# Patient Record
Sex: Female | Born: 1965 | Race: White | Hispanic: No | Marital: Married | Smoking: Former smoker
Health system: Southern US, Community
[De-identification: ages and names within clinical notes are randomized; demographics above are authoritative.]

## PROBLEM LIST (undated history)

## (undated) DIAGNOSIS — E876 Hypokalemia: Secondary | ICD-10-CM

## (undated) DIAGNOSIS — L03314 Cellulitis of groin: Secondary | ICD-10-CM

## (undated) DIAGNOSIS — K648 Other hemorrhoids: Secondary | ICD-10-CM

## (undated) DIAGNOSIS — J329 Chronic sinusitis, unspecified: Secondary | ICD-10-CM

## (undated) DIAGNOSIS — M1990 Unspecified osteoarthritis, unspecified site: Secondary | ICD-10-CM

## (undated) DIAGNOSIS — J9383 Other pneumothorax: Secondary | ICD-10-CM

## (undated) DIAGNOSIS — E538 Deficiency of other specified B group vitamins: Secondary | ICD-10-CM

## (undated) DIAGNOSIS — D649 Anemia, unspecified: Secondary | ICD-10-CM

## (undated) HISTORY — DX: Other hemorrhoids: K64.8

## (undated) HISTORY — PX: WISDOM TOOTH EXTRACTION: SHX21

## (undated) HISTORY — DX: Chronic sinusitis, unspecified: J32.9

## (undated) HISTORY — DX: Other pneumothorax: J93.83

## (undated) HISTORY — DX: Hypokalemia: E87.6

## (undated) HISTORY — DX: Deficiency of other specified B group vitamins: E53.8

## (undated) HISTORY — DX: Unspecified osteoarthritis, unspecified site: M19.90

## (undated) HISTORY — DX: Cellulitis of groin: L03.314

## (undated) HISTORY — DX: Anemia, unspecified: D64.9

---

## 1997-12-26 ENCOUNTER — Other Ambulatory Visit: Admission: RE | Admit: 1997-12-26 | Discharge: 1997-12-26 | Payer: Self-pay | Admitting: *Deleted

## 1998-02-21 ENCOUNTER — Ambulatory Visit (HOSPITAL_COMMUNITY): Admission: RE | Admit: 1998-02-21 | Discharge: 1998-02-21 | Payer: Self-pay | Admitting: *Deleted

## 1999-02-11 ENCOUNTER — Other Ambulatory Visit: Admission: RE | Admit: 1999-02-11 | Discharge: 1999-02-11 | Payer: Self-pay | Admitting: *Deleted

## 1999-09-10 ENCOUNTER — Inpatient Hospital Stay (HOSPITAL_COMMUNITY): Admission: AD | Admit: 1999-09-10 | Discharge: 1999-09-12 | Payer: Self-pay | Admitting: *Deleted

## 2000-09-23 ENCOUNTER — Other Ambulatory Visit: Admission: RE | Admit: 2000-09-23 | Discharge: 2000-09-23 | Payer: Self-pay | Admitting: Obstetrics and Gynecology

## 2002-08-23 ENCOUNTER — Encounter: Admission: RE | Admit: 2002-08-23 | Discharge: 2002-08-23 | Payer: Self-pay | Admitting: Family Medicine

## 2002-08-23 ENCOUNTER — Encounter: Payer: Self-pay | Admitting: Family Medicine

## 2002-08-24 ENCOUNTER — Other Ambulatory Visit: Admission: RE | Admit: 2002-08-24 | Discharge: 2002-08-24 | Payer: Self-pay | Admitting: Obstetrics and Gynecology

## 2002-08-25 ENCOUNTER — Encounter: Payer: Self-pay | Admitting: Obstetrics and Gynecology

## 2002-08-25 ENCOUNTER — Encounter: Admission: RE | Admit: 2002-08-25 | Discharge: 2002-08-25 | Payer: Self-pay | Admitting: Internal Medicine

## 2002-08-25 ENCOUNTER — Encounter: Admission: RE | Admit: 2002-08-25 | Discharge: 2002-08-25 | Payer: Self-pay | Admitting: Obstetrics and Gynecology

## 2002-08-25 ENCOUNTER — Encounter: Payer: Self-pay | Admitting: Internal Medicine

## 2002-08-26 ENCOUNTER — Encounter: Admission: RE | Admit: 2002-08-26 | Discharge: 2002-08-26 | Payer: Self-pay | Admitting: Internal Medicine

## 2002-08-26 ENCOUNTER — Inpatient Hospital Stay (HOSPITAL_COMMUNITY): Admission: EM | Admit: 2002-08-26 | Discharge: 2002-09-04 | Payer: Self-pay | Admitting: Internal Medicine

## 2002-08-26 ENCOUNTER — Encounter: Payer: Self-pay | Admitting: Internal Medicine

## 2002-08-28 DIAGNOSIS — J9383 Other pneumothorax: Secondary | ICD-10-CM

## 2002-08-28 HISTORY — DX: Other pneumothorax: J93.83

## 2002-08-28 HISTORY — PX: LUNG SURGERY: SHX703

## 2002-08-30 ENCOUNTER — Encounter (INDEPENDENT_AMBULATORY_CARE_PROVIDER_SITE_OTHER): Payer: Self-pay | Admitting: Specialist

## 2002-08-30 ENCOUNTER — Encounter: Payer: Self-pay | Admitting: Cardiothoracic Surgery

## 2002-08-31 ENCOUNTER — Encounter: Payer: Self-pay | Admitting: Cardiothoracic Surgery

## 2002-09-01 ENCOUNTER — Encounter: Payer: Self-pay | Admitting: Cardiothoracic Surgery

## 2002-09-02 ENCOUNTER — Encounter: Payer: Self-pay | Admitting: Cardiothoracic Surgery

## 2002-09-03 ENCOUNTER — Encounter: Payer: Self-pay | Admitting: Cardiothoracic Surgery

## 2002-09-04 ENCOUNTER — Encounter: Payer: Self-pay | Admitting: Cardiothoracic Surgery

## 2002-09-16 ENCOUNTER — Encounter: Admission: RE | Admit: 2002-09-16 | Discharge: 2002-09-16 | Payer: Self-pay | Admitting: Cardiothoracic Surgery

## 2002-09-16 ENCOUNTER — Encounter: Payer: Self-pay | Admitting: Cardiothoracic Surgery

## 2002-09-19 ENCOUNTER — Encounter: Admission: RE | Admit: 2002-09-19 | Discharge: 2002-09-19 | Payer: Self-pay | Admitting: Cardiothoracic Surgery

## 2002-09-19 ENCOUNTER — Encounter: Payer: Self-pay | Admitting: Cardiothoracic Surgery

## 2003-08-30 ENCOUNTER — Encounter: Payer: Self-pay | Admitting: Internal Medicine

## 2003-08-30 HISTORY — PX: COLONOSCOPY: SHX174

## 2003-11-30 ENCOUNTER — Other Ambulatory Visit: Admission: RE | Admit: 2003-11-30 | Discharge: 2003-11-30 | Payer: Self-pay | Admitting: Obstetrics and Gynecology

## 2004-02-08 ENCOUNTER — Ambulatory Visit: Payer: Self-pay | Admitting: Family Medicine

## 2005-05-29 ENCOUNTER — Ambulatory Visit: Payer: Self-pay | Admitting: Family Medicine

## 2005-06-03 ENCOUNTER — Encounter: Admission: RE | Admit: 2005-06-03 | Discharge: 2005-06-03 | Payer: Self-pay | Admitting: Family Medicine

## 2005-07-21 ENCOUNTER — Ambulatory Visit: Payer: Self-pay | Admitting: Family Medicine

## 2005-10-06 ENCOUNTER — Ambulatory Visit: Payer: Self-pay | Admitting: Family Medicine

## 2005-10-14 ENCOUNTER — Ambulatory Visit: Payer: Self-pay | Admitting: Family Medicine

## 2005-10-28 ENCOUNTER — Ambulatory Visit: Payer: Self-pay | Admitting: Internal Medicine

## 2005-11-28 ENCOUNTER — Encounter (INDEPENDENT_AMBULATORY_CARE_PROVIDER_SITE_OTHER): Payer: Self-pay | Admitting: *Deleted

## 2005-11-28 ENCOUNTER — Ambulatory Visit: Payer: Self-pay | Admitting: Internal Medicine

## 2006-01-05 ENCOUNTER — Ambulatory Visit: Payer: Self-pay | Admitting: Internal Medicine

## 2006-02-02 ENCOUNTER — Ambulatory Visit: Payer: Self-pay | Admitting: Family Medicine

## 2006-02-06 ENCOUNTER — Emergency Department (HOSPITAL_COMMUNITY): Admission: EM | Admit: 2006-02-06 | Discharge: 2006-02-07 | Payer: Self-pay | Admitting: Emergency Medicine

## 2006-02-06 ENCOUNTER — Ambulatory Visit: Payer: Self-pay | Admitting: Family Medicine

## 2006-02-06 LAB — CONVERTED CEMR LAB
Alkaline Phosphatase: 63 units/L (ref 39–117)
Basophils Absolute: 0 10*3/uL (ref 0.0–0.1)
Basophils Relative: 0.4 % (ref 0.0–1.0)
Eosinophil percent: 2.2 % (ref 0.0–5.0)
GFR calc non Af Amer: 85 mL/min
Glucose, Bld: 109 mg/dL — ABNORMAL HIGH (ref 70–99)
H Pylori IgG: NEGATIVE
HCT: 34.5 % — ABNORMAL LOW (ref 36.0–46.0)
Hemoglobin: 11.6 g/dL — ABNORMAL LOW (ref 12.0–15.0)
MCV: 81.6 fL (ref 78.0–100.0)
Monocytes Absolute: 0.7 10*3/uL (ref 0.2–0.7)
Neutrophils Relative %: 74.3 % (ref 43.0–77.0)
Platelets: 511 10*3/uL — ABNORMAL HIGH (ref 150–400)
Potassium: 2.9 meq/L — ABNORMAL LOW (ref 3.5–5.1)
Sodium: 134 meq/L — ABNORMAL LOW (ref 135–145)
Total Bilirubin: 0.5 mg/dL (ref 0.3–1.2)
WBC: 9.6 10*3/uL (ref 4.5–10.5)

## 2006-02-11 ENCOUNTER — Inpatient Hospital Stay (HOSPITAL_COMMUNITY): Admission: EM | Admit: 2006-02-11 | Discharge: 2006-02-19 | Payer: Self-pay | Admitting: Emergency Medicine

## 2006-02-11 ENCOUNTER — Encounter: Payer: Self-pay | Admitting: Internal Medicine

## 2006-02-11 ENCOUNTER — Encounter (INDEPENDENT_AMBULATORY_CARE_PROVIDER_SITE_OTHER): Payer: Self-pay | Admitting: Specialist

## 2006-02-12 ENCOUNTER — Ambulatory Visit: Payer: Self-pay | Admitting: Internal Medicine

## 2006-02-17 ENCOUNTER — Encounter: Payer: Self-pay | Admitting: Vascular Surgery

## 2006-02-26 ENCOUNTER — Ambulatory Visit: Payer: Self-pay | Admitting: Internal Medicine

## 2006-02-26 LAB — CONVERTED CEMR LAB
Albumin: 1.7 g/dL — ABNORMAL LOW (ref 3.5–5.2)
Alkaline Phosphatase: 64 units/L (ref 39–117)
Calcium: 7.8 mg/dL — ABNORMAL LOW (ref 8.4–10.5)
Chloride: 98 meq/L (ref 96–112)
Eosinophils Relative: 0.9 % (ref 0.0–5.0)
GFR calc Af Amer: 229 mL/min
Glucose, Bld: 115 mg/dL — ABNORMAL HIGH (ref 70–99)
HCT: 22.4 % — CL (ref 36.0–46.0)
Lymphocytes Relative: 13.5 % (ref 12.0–46.0)
Monocytes Relative: 3.5 % (ref 3.0–11.0)
Neutrophils Relative %: 81.4 % — ABNORMAL HIGH (ref 43.0–77.0)
Sodium: 135 meq/L (ref 135–145)
Total Protein: 4.8 g/dL — ABNORMAL LOW (ref 6.0–8.3)

## 2006-03-13 ENCOUNTER — Ambulatory Visit: Payer: Self-pay | Admitting: Internal Medicine

## 2006-03-13 ENCOUNTER — Ambulatory Visit (HOSPITAL_COMMUNITY): Admission: RE | Admit: 2006-03-13 | Discharge: 2006-03-13 | Payer: Self-pay | Admitting: Internal Medicine

## 2006-04-01 ENCOUNTER — Ambulatory Visit: Payer: Self-pay | Admitting: Internal Medicine

## 2006-04-01 LAB — CONVERTED CEMR LAB
AST: 18 units/L (ref 0–37)
Basophils Absolute: 0 10*3/uL (ref 0.0–0.1)
Basophils Relative: 0 % (ref 0.0–1.0)
Eosinophils Absolute: 0 10*3/uL (ref 0.0–0.6)
Eosinophils Relative: 0.6 % (ref 0.0–5.0)
Hemoglobin: 8.5 g/dL — ABNORMAL LOW (ref 12.0–15.0)
MCHC: 31.5 g/dL (ref 30.0–36.0)
MCV: 80.3 fL (ref 78.0–100.0)
Monocytes Absolute: 0.5 10*3/uL (ref 0.2–0.7)
Neutro Abs: 4.7 10*3/uL (ref 1.4–7.7)
RDW: 18.2 % — ABNORMAL HIGH (ref 11.5–14.6)
Total Protein: 5.1 g/dL — ABNORMAL LOW (ref 6.0–8.3)

## 2006-04-07 ENCOUNTER — Ambulatory Visit: Payer: Self-pay | Admitting: Internal Medicine

## 2006-04-14 ENCOUNTER — Ambulatory Visit: Payer: Self-pay | Admitting: Internal Medicine

## 2006-04-14 LAB — CONVERTED CEMR LAB
ALT: 15 units/L (ref 0–40)
AST: 16 units/L (ref 0–37)
Alkaline Phosphatase: 41 units/L (ref 39–117)
BUN: 11 mg/dL (ref 6–23)
CO2: 28 meq/L (ref 19–32)
Calcium: 8.2 mg/dL — ABNORMAL LOW (ref 8.4–10.5)
Eosinophils Relative: 1.4 % (ref 0.0–5.0)
HCT: 26 % — ABNORMAL LOW (ref 36.0–46.0)
MCV: 82.2 fL (ref 78.0–100.0)
Monocytes Relative: 6.1 % (ref 3.0–11.0)
Neutro Abs: 3.8 10*3/uL (ref 1.4–7.7)
Neutrophils Relative %: 63.3 % (ref 43.0–77.0)
Platelets: 296 10*3/uL (ref 150–400)
RBC: 3.17 M/uL — ABNORMAL LOW (ref 3.87–5.11)
WBC: 6 10*3/uL (ref 4.5–10.5)

## 2006-05-05 ENCOUNTER — Ambulatory Visit: Payer: Self-pay | Admitting: Internal Medicine

## 2006-05-05 LAB — CONVERTED CEMR LAB
ALT: 16 units/L (ref 0–40)
AST: 18 units/L (ref 0–37)
BUN: 13 mg/dL (ref 6–23)
Basophils Absolute: 0 10*3/uL (ref 0.0–0.1)
Basophils Relative: 0.5 % (ref 0.0–1.0)
Bilirubin, Direct: 0.1 mg/dL (ref 0.0–0.3)
Calcium: 8.6 mg/dL (ref 8.4–10.5)
Chloride: 111 meq/L (ref 96–112)
Creatinine, Ser: 0.6 mg/dL (ref 0.4–1.2)
Eosinophils Absolute: 0 10*3/uL (ref 0.0–0.6)
Eosinophils Relative: 0.8 % (ref 0.0–5.0)
GFR calc Af Amer: 143 mL/min
Glucose, Bld: 91 mg/dL (ref 70–99)
Monocytes Absolute: 0.4 10*3/uL (ref 0.2–0.7)
Neutrophils Relative %: 54.4 % (ref 43.0–77.0)
Platelets: 256 10*3/uL (ref 150–400)
Potassium: 4.6 meq/L (ref 3.5–5.1)
RDW: 18.2 % — ABNORMAL HIGH (ref 11.5–14.6)
Total Bilirubin: 0.6 mg/dL (ref 0.3–1.2)
Total Protein: 5.3 g/dL — ABNORMAL LOW (ref 6.0–8.3)

## 2006-05-08 ENCOUNTER — Ambulatory Visit: Payer: Self-pay | Admitting: Internal Medicine

## 2006-05-15 ENCOUNTER — Ambulatory Visit: Payer: Self-pay | Admitting: Internal Medicine

## 2006-05-22 ENCOUNTER — Ambulatory Visit: Payer: Self-pay | Admitting: Internal Medicine

## 2006-05-29 ENCOUNTER — Ambulatory Visit: Payer: Self-pay | Admitting: Internal Medicine

## 2006-06-30 ENCOUNTER — Ambulatory Visit: Payer: Self-pay | Admitting: Internal Medicine

## 2006-06-30 LAB — CONVERTED CEMR LAB
AST: 25 units/L (ref 0–37)
Basophils Relative: 0.1 % (ref 0.0–1.0)
Eosinophils Relative: 2.8 % (ref 0.0–5.0)
HCT: 36.5 % (ref 36.0–46.0)
Lymphocytes Relative: 32.7 % (ref 12.0–46.0)
Neutro Abs: 2.6 10*3/uL (ref 1.4–7.7)
Total Bilirubin: 0.7 mg/dL (ref 0.3–1.2)
Vitamin B-12: >1500 pg/mL — ABNORMAL HIGH (ref 211–911)
WBC: 4.5 10*3/uL (ref 4.5–10.5)

## 2006-07-14 ENCOUNTER — Ambulatory Visit: Payer: Self-pay | Admitting: Internal Medicine

## 2006-07-14 LAB — CONVERTED CEMR LAB
AST: 21 units/L (ref 0–37)
Albumin: 3.4 g/dL — ABNORMAL LOW (ref 3.5–5.2)
Alkaline Phosphatase: 38 units/L — ABNORMAL LOW (ref 39–117)
Basophils Relative: 0.5 % (ref 0.0–1.0)
Bilirubin, Direct: 0.1 mg/dL (ref 0.0–0.3)
CO2: 30 meq/L (ref 19–32)
CRP, High Sensitivity: 1 (ref 0.00–5.00)
Calcium: 9.1 mg/dL (ref 8.4–10.5)
Chloride: 104 meq/L (ref 96–112)
Creatinine, Ser: 0.7 mg/dL (ref 0.4–1.2)
Eosinophils Absolute: 0.1 10*3/uL (ref 0.0–0.6)
Glucose, Bld: 138 mg/dL — ABNORMAL HIGH (ref 70–99)
Hemoglobin: 12.6 g/dL (ref 12.0–15.0)
Monocytes Absolute: 0.2 10*3/uL (ref 0.2–0.7)
Monocytes Relative: 4.5 % (ref 3.0–11.0)
Neutrophils Relative %: 72.2 % (ref 43.0–77.0)
RBC: 4.39 M/uL (ref 3.87–5.11)
RDW: 16.1 % — ABNORMAL HIGH (ref 11.5–14.6)
Sed Rate: 10 mm/hr (ref 0–25)
Sodium: 139 meq/L (ref 135–145)

## 2006-08-03 ENCOUNTER — Ambulatory Visit: Payer: Self-pay | Admitting: Internal Medicine

## 2006-08-28 ENCOUNTER — Ambulatory Visit: Payer: Self-pay | Admitting: Internal Medicine

## 2006-09-21 ENCOUNTER — Ambulatory Visit: Payer: Self-pay | Admitting: Internal Medicine

## 2006-09-21 DIAGNOSIS — D649 Anemia, unspecified: Secondary | ICD-10-CM

## 2006-09-21 LAB — CONVERTED CEMR LAB
AST: 18 units/L (ref 0–37)
Bilirubin, Direct: 0.1 mg/dL (ref 0.0–0.3)
HCT: 34.3 % — ABNORMAL LOW (ref 36.0–46.0)
Lymphocytes Relative: 35.5 % (ref 12.0–46.0)
RBC: 3.92 M/uL (ref 3.87–5.11)
WBC: 3.6 10*3/uL — ABNORMAL LOW (ref 4.5–10.5)

## 2006-10-05 ENCOUNTER — Ambulatory Visit: Payer: Self-pay | Admitting: Internal Medicine

## 2006-12-04 ENCOUNTER — Ambulatory Visit: Payer: Self-pay | Admitting: Internal Medicine

## 2006-12-04 LAB — CONVERTED CEMR LAB
Basophils Absolute: 0 10*3/uL (ref 0.0–0.1)
Basophils Relative: 0.1 % (ref 0.0–1.0)
Hemoglobin: 10.3 g/dL — ABNORMAL LOW (ref 12.0–15.0)
Lymphocytes Relative: 20.9 % (ref 12.0–46.0)
MCV: 89.7 fL (ref 78.0–100.0)
Monocytes Absolute: 0.2 10*3/uL (ref 0.2–0.7)
Platelets: 223 10*3/uL (ref 150–400)
WBC: 5.5 10*3/uL (ref 4.5–10.5)

## 2007-01-18 ENCOUNTER — Encounter: Payer: Self-pay | Admitting: Family Medicine

## 2007-01-19 ENCOUNTER — Encounter: Payer: Self-pay | Admitting: Internal Medicine

## 2007-01-19 ENCOUNTER — Ambulatory Visit: Payer: Self-pay | Admitting: Internal Medicine

## 2007-01-19 ENCOUNTER — Ambulatory Visit: Payer: Self-pay | Admitting: Family Medicine

## 2007-01-19 DIAGNOSIS — E538 Deficiency of other specified B group vitamins: Secondary | ICD-10-CM | POA: Insufficient documentation

## 2007-01-19 DIAGNOSIS — J939 Pneumothorax, unspecified: Secondary | ICD-10-CM | POA: Insufficient documentation

## 2007-01-19 DIAGNOSIS — K5100 Ulcerative (chronic) pancolitis without complications: Secondary | ICD-10-CM

## 2007-01-19 DIAGNOSIS — J309 Allergic rhinitis, unspecified: Secondary | ICD-10-CM | POA: Insufficient documentation

## 2007-01-19 DIAGNOSIS — J930 Spontaneous tension pneumothorax: Secondary | ICD-10-CM | POA: Insufficient documentation

## 2007-01-19 DIAGNOSIS — L723 Sebaceous cyst: Secondary | ICD-10-CM

## 2007-01-19 DIAGNOSIS — D500 Iron deficiency anemia secondary to blood loss (chronic): Secondary | ICD-10-CM

## 2007-01-19 DIAGNOSIS — M810 Age-related osteoporosis without current pathological fracture: Secondary | ICD-10-CM | POA: Insufficient documentation

## 2007-01-19 LAB — CONVERTED CEMR LAB
Albumin: 3.8 g/dL (ref 3.5–5.2)
BUN: 12 mg/dL (ref 6–23)
Bilirubin, Direct: 0.2 mg/dL (ref 0.0–0.3)
CO2: 25 meq/L (ref 19–32)
Calcium: 8.9 mg/dL (ref 8.4–10.5)
Chloride: 104 meq/L (ref 96–112)
Creatinine, Ser: 0.6 mg/dL (ref 0.4–1.2)
Eosinophils Relative: 2.6 % (ref 0.0–5.0)
Folate: 7.1 ng/mL
Glucose, Bld: 124 mg/dL — ABNORMAL HIGH (ref 70–99)
Lymphocytes Relative: 27.5 % (ref 12.0–46.0)
MCHC: 33.4 g/dL (ref 30.0–36.0)
MCV: 83.5 fL (ref 78.0–100.0)
Monocytes Absolute: 0.2 10*3/uL (ref 0.2–0.7)
Monocytes Relative: 5.7 % (ref 3.0–11.0)
Neutrophils Relative %: 64.1 % (ref 43.0–77.0)
Potassium: 3.6 meq/L (ref 3.5–5.1)
Sodium: 136 meq/L (ref 135–145)
Transferrin: 348.4 mg/dL (ref >=212.0)
WBC: 4 10*3/uL — ABNORMAL LOW (ref 4.5–10.5)

## 2007-01-20 ENCOUNTER — Encounter: Payer: Self-pay | Admitting: Family Medicine

## 2007-01-20 ENCOUNTER — Encounter: Payer: Self-pay | Admitting: Internal Medicine

## 2007-01-20 LAB — CONVERTED CEMR LAB: Vit D, 1,25-Dihydroxy: 28 — ABNORMAL LOW (ref 30–89)

## 2007-02-11 ENCOUNTER — Telehealth (INDEPENDENT_AMBULATORY_CARE_PROVIDER_SITE_OTHER): Payer: Self-pay | Admitting: *Deleted

## 2007-03-19 ENCOUNTER — Ambulatory Visit: Payer: Self-pay | Admitting: Family Medicine

## 2007-03-19 ENCOUNTER — Telehealth (INDEPENDENT_AMBULATORY_CARE_PROVIDER_SITE_OTHER): Payer: Self-pay | Admitting: *Deleted

## 2007-04-01 ENCOUNTER — Ambulatory Visit: Payer: Self-pay | Admitting: Family Medicine

## 2007-04-01 LAB — CONVERTED CEMR LAB
Ketones, urine, test strip: NEGATIVE
Nitrite: NEGATIVE
Protein, U semiquant: NEGATIVE
Specific Gravity, Urine: <1.005
WBC Urine, dipstick: NEGATIVE

## 2007-04-12 LAB — CONVERTED CEMR LAB
Albumin: 4.1 g/dL (ref 3.5–5.2)
Alkaline Phosphatase: 44 units/L (ref 39–117)
BUN: 9 mg/dL (ref 6–23)
CO2: 26 meq/L (ref 19–32)
Chloride: 104 meq/L (ref 96–112)
Cholesterol: 227 mg/dL (ref 0–200)
Eosinophils Relative: 6.4 % — ABNORMAL HIGH (ref 0.0–5.0)
GFR calc non Af Amer: 118 mL/min
Glucose, Bld: 98 mg/dL (ref 70–99)
HDL: 63.2 mg/dL (ref >39.0)
Hemoglobin: 10 g/dL — ABNORMAL LOW (ref 12.0–15.0)
Lymphocytes Relative: 37.4 % (ref 12.0–46.0)
MCV: 76.6 fL — ABNORMAL LOW (ref 78.0–100.0)
Monocytes Absolute: 0.3 10*3/uL (ref 0.2–0.7)
Monocytes Relative: 6.8 % (ref 3.0–11.0)
Neutrophils Relative %: 49 % (ref 43.0–77.0)
Platelets: 220 10*3/uL (ref 150–400)
Potassium: 4 meq/L (ref 3.5–5.1)
Sodium: 136 meq/L (ref 135–145)
Total Bilirubin: 0.7 mg/dL (ref 0.3–1.2)
Total CHOL/HDL Ratio: 3.6
Triglycerides: 73 mg/dL (ref 0–149)

## 2007-04-15 DIAGNOSIS — F17200 Nicotine dependence, unspecified, uncomplicated: Secondary | ICD-10-CM | POA: Insufficient documentation

## 2007-04-15 DIAGNOSIS — M129 Arthropathy, unspecified: Secondary | ICD-10-CM | POA: Insufficient documentation

## 2007-04-15 DIAGNOSIS — K649 Unspecified hemorrhoids: Secondary | ICD-10-CM | POA: Insufficient documentation

## 2007-04-15 DIAGNOSIS — K5289 Other specified noninfective gastroenteritis and colitis: Secondary | ICD-10-CM | POA: Insufficient documentation

## 2008-01-12 ENCOUNTER — Encounter: Payer: Self-pay | Admitting: Family Medicine

## 2008-02-01 ENCOUNTER — Ambulatory Visit: Payer: Self-pay | Admitting: Internal Medicine

## 2008-02-02 ENCOUNTER — Encounter: Payer: Self-pay | Admitting: Internal Medicine

## 2008-02-03 LAB — CONVERTED CEMR LAB
ALT: 13 units/L (ref 0–35)
Alkaline Phosphatase: 51 units/L (ref 39–117)
Basophils Absolute: 0 10*3/uL (ref 0.0–0.1)
Basophils Relative: 0.7 % (ref 0.0–3.0)
CO2: 28 meq/L (ref 19–32)
Calcium: 9.3 mg/dL (ref 8.4–10.5)
Chloride: 101 meq/L (ref 96–112)
Creatinine, Ser: 0.5 mg/dL (ref 0.4–1.2)
Glucose, Bld: 123 mg/dL — ABNORMAL HIGH (ref 70–99)
HCT: 26 % — ABNORMAL LOW (ref 36.0–46.0)
Lymphocytes Relative: 31.7 % (ref 12.0–46.0)
MCV: 66.8 fL — ABNORMAL LOW (ref 78.0–100.0)
Monocytes Relative: 8.4 % (ref 3.0–12.0)
Neutrophils Relative %: 55.5 % (ref 43.0–77.0)
RBC: 3.88 M/uL (ref 3.87–5.11)
Sodium: 137 meq/L (ref 135–145)
Total Protein: 6.5 g/dL (ref 6.0–8.3)
Vitamin B-12: 442 pg/mL (ref 211–911)
WBC: 4.6 10*3/uL (ref 4.5–10.5)

## 2008-08-25 IMAGING — CR DG CHEST 2V
2 series · 2 of 2 positions shown · non-contrast
Comparison: 06/03/05
COMPARISON: None

CLINICAL DATA: Abdominal pain with nausea, vomiting, and fever.
 CHEST ? 2 VIEW:

[w chest pa *]
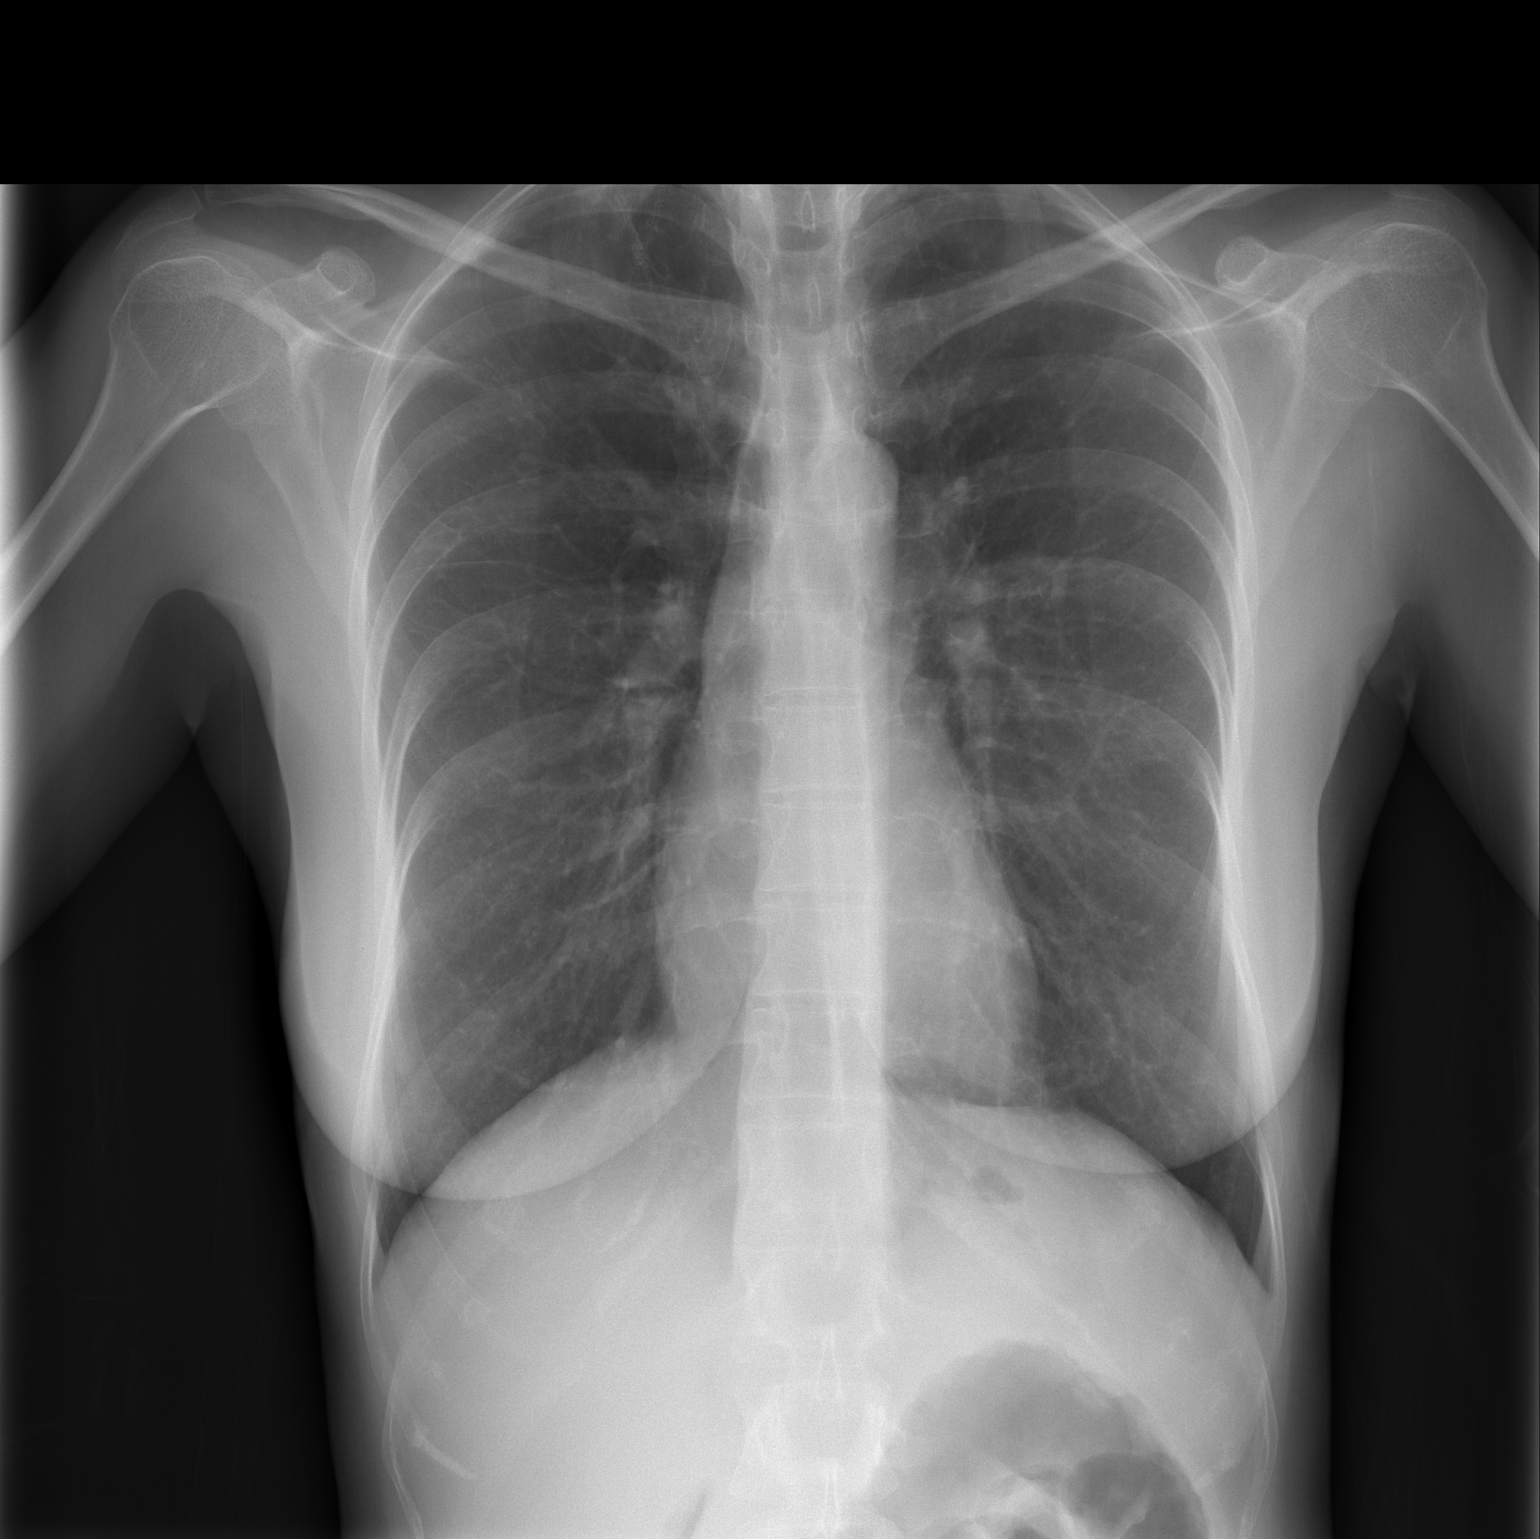

[w chest lat]
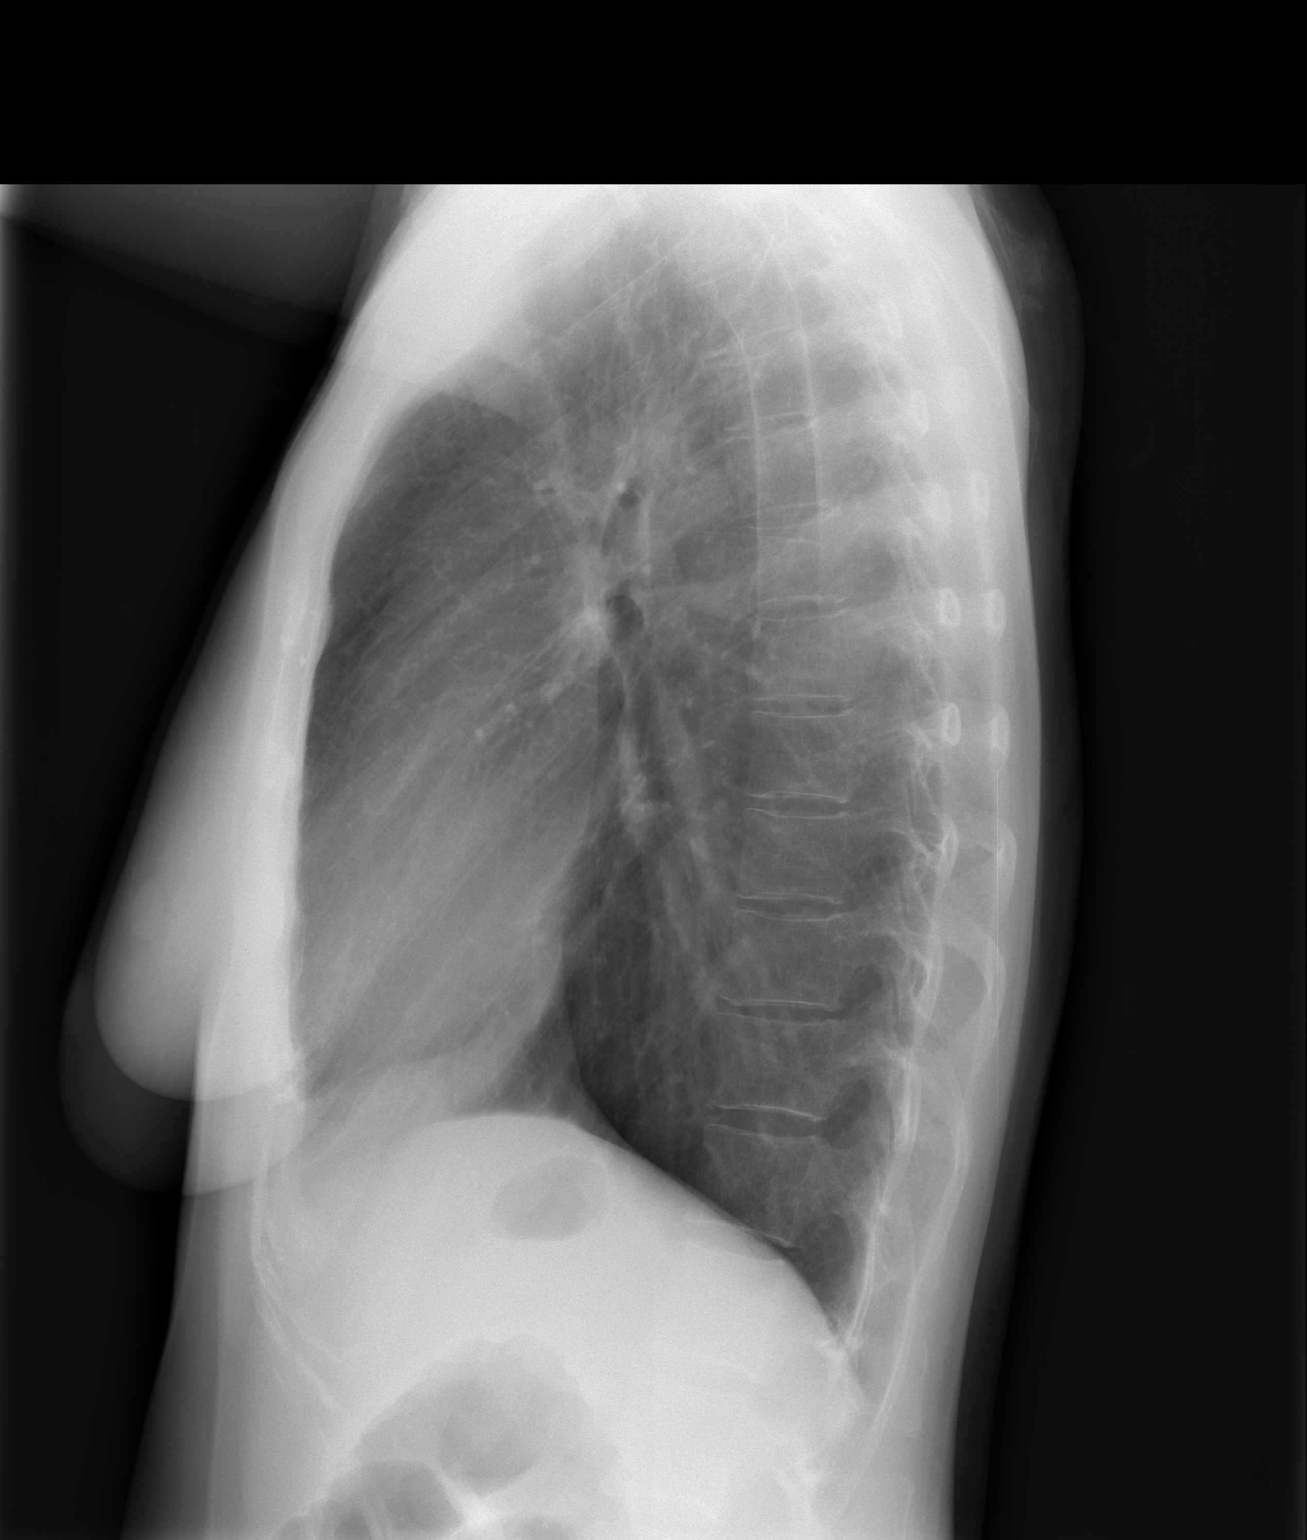

[2 of 2 positions shown; findings below may reference images not displayed]

FINDINGS: Status post surgical resection in right lung apex.  There are small gauge sutures and apical pleural thickening and scarring as before.  There is no acute process.  Lungs are mildly hyperaerated suggesting an element of COPD.  The bones appear somewhat demineralized for age but there are no lesions.
IMPRESSION: Chronic and postoperative changes ? no active disease.
 ABDOMEN ? 1 VIEW:
FINDINGS: The transverse colon, splenic flexure, and descending colon show probable bowel wall thickening and lack haustrations.  Findings are suspicious for colitis.  No large or small bowel obstruction.  No pneumatosis or portal venous air.
IMPRESSION: Findings suggestive of colitis.

## 2008-08-29 IMAGING — CR DG ABDOMEN 2V
2 series · 2 of 2 positions shown · non-contrast
Comparison: 02/10/06

CLINICAL DATA: Abdominal pain and bloating. 
 ABDOMEN- 2 VIEWS:

[view not recorded (1 of 2)]
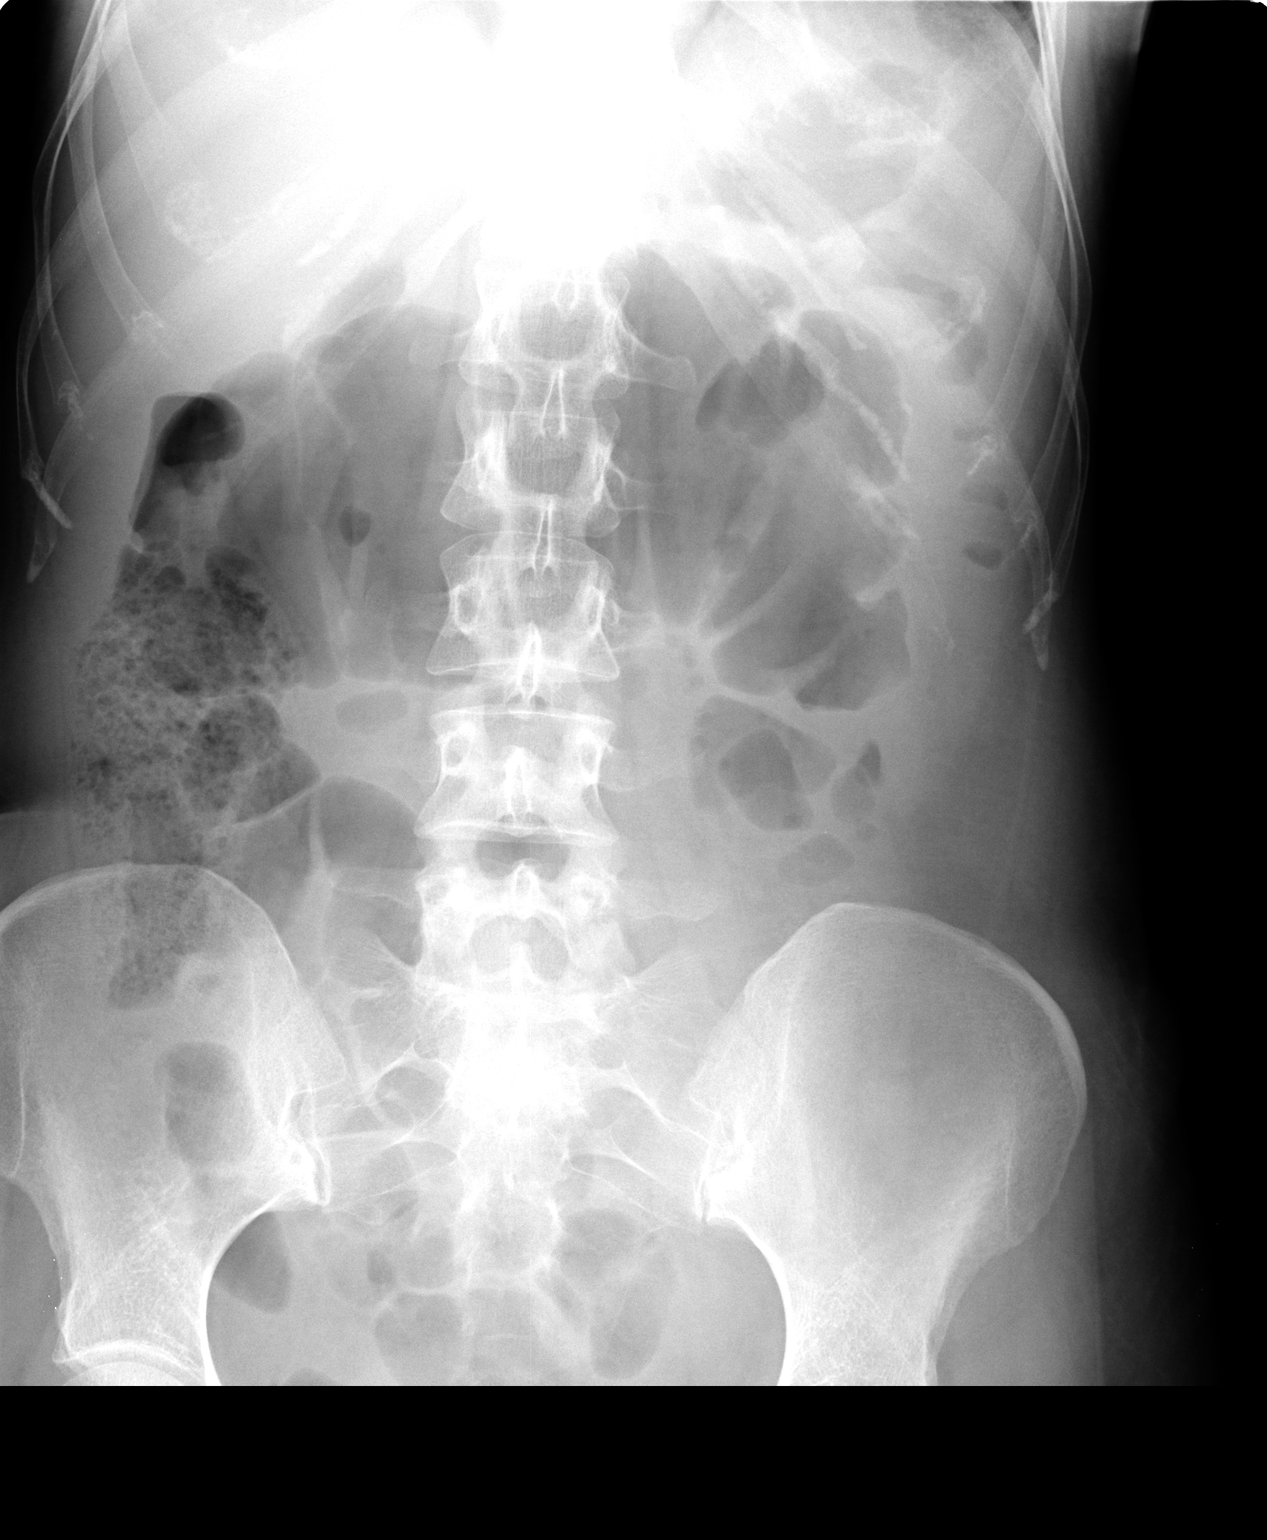

[view not recorded (2 of 2)]
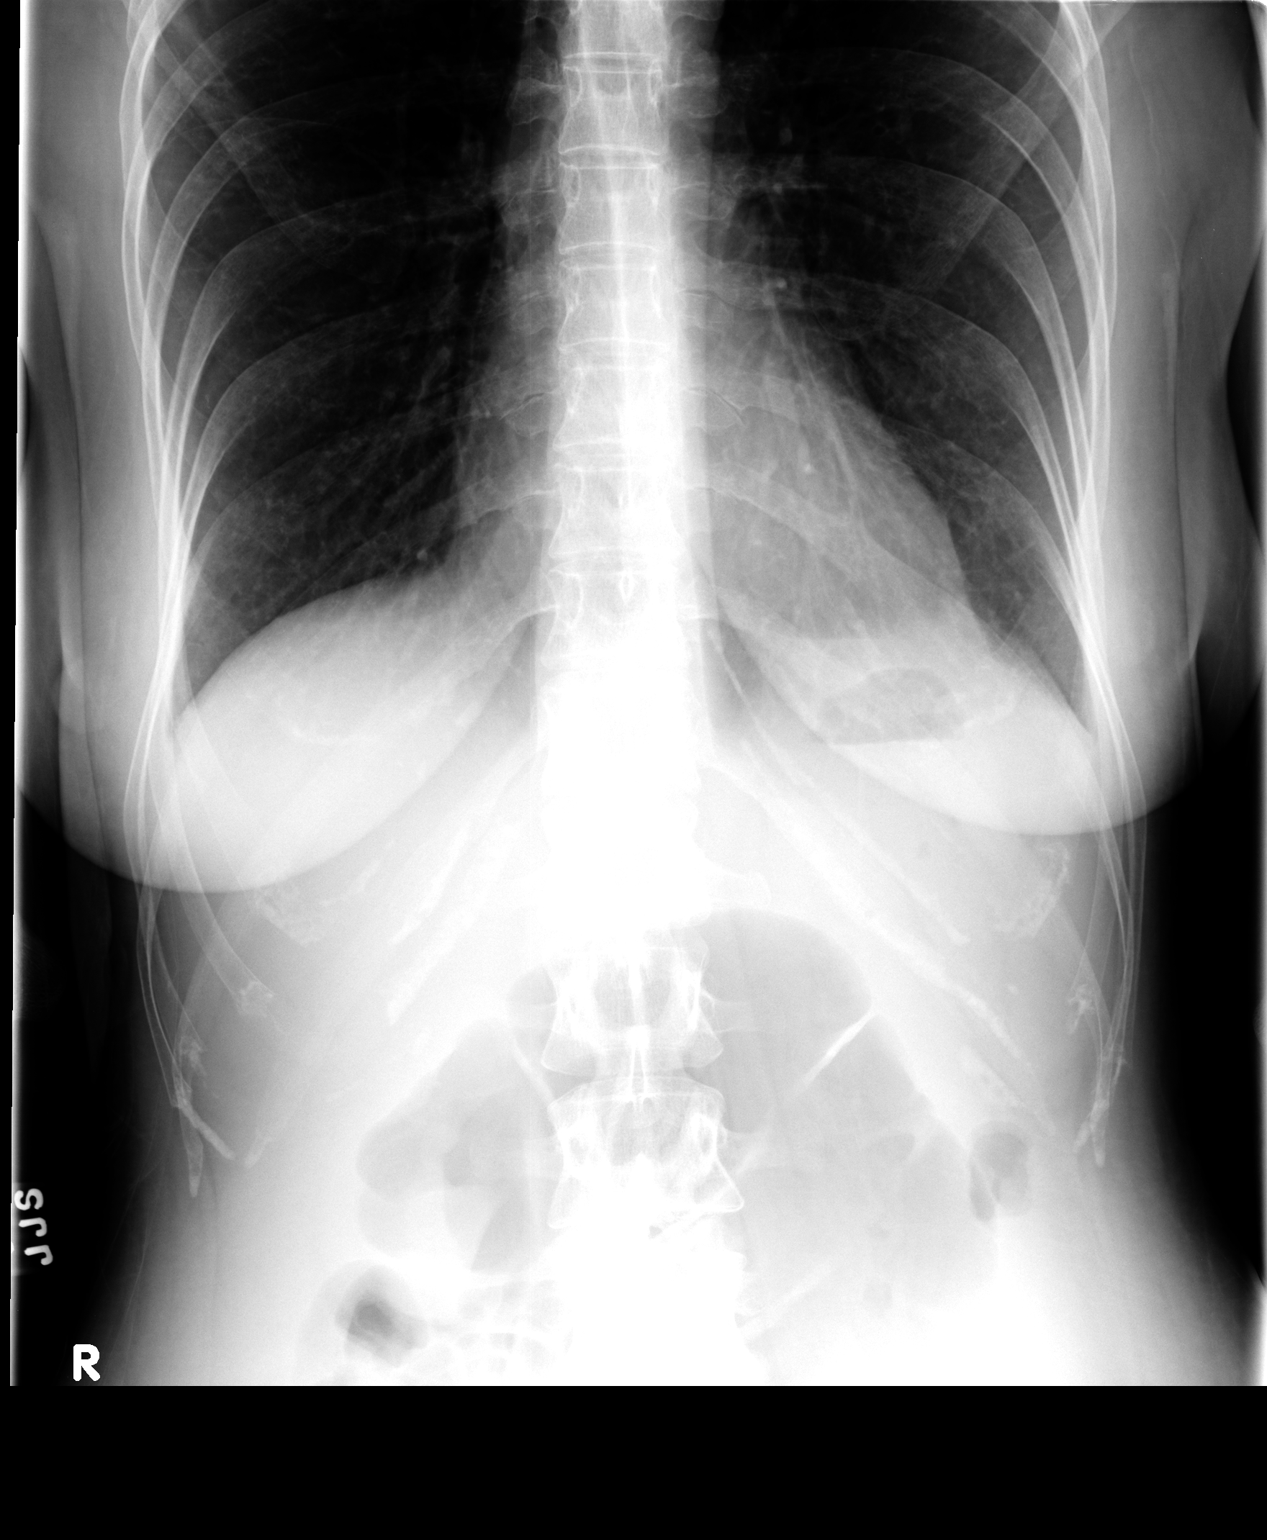

[2 of 2 positions shown; findings below may reference images not displayed]

FINDINGS: There is no free intraperitoneal air.  There is gaseous distention of the transverse colon.  Some gas is present in the sigmoid colon and rectum.  No evidence of bowel obstruction.
IMPRESSION: Gaseous distention of the transverse colon.  Negative for free air or small bowel obstruction.

## 2009-03-19 ENCOUNTER — Encounter: Payer: Self-pay | Admitting: Family Medicine

## 2009-12-25 ENCOUNTER — Telehealth: Payer: Self-pay | Admitting: Family Medicine

## 2010-01-29 ENCOUNTER — Encounter: Payer: Self-pay | Admitting: Family Medicine

## 2010-01-29 ENCOUNTER — Ambulatory Visit
Admission: RE | Admit: 2010-01-29 | Discharge: 2010-01-29 | Payer: Self-pay | Source: Home / Self Care | Attending: Family Medicine | Admitting: Family Medicine

## 2010-01-29 LAB — CONVERTED CEMR LAB
Glucose, Urine, Semiquant: NEGATIVE
Specific Gravity, Urine: 1.01
pH: 5

## 2010-01-30 ENCOUNTER — Telehealth (INDEPENDENT_AMBULATORY_CARE_PROVIDER_SITE_OTHER): Payer: Self-pay | Admitting: *Deleted

## 2010-01-30 ENCOUNTER — Encounter (INDEPENDENT_AMBULATORY_CARE_PROVIDER_SITE_OTHER): Payer: Self-pay | Admitting: *Deleted

## 2010-01-30 LAB — CONVERTED CEMR LAB
ALT: 18 units/L (ref 0–35)
Alkaline Phosphatase: 40 units/L (ref 39–117)
Basophils Absolute: 0 10*3/uL (ref 0.0–0.1)
Basophils Relative: 0.4 % (ref 0.0–3.0)
Bilirubin, Direct: 0.1 mg/dL (ref 0.0–0.3)
CO2: 25 meq/L (ref 19–32)
Calcium: 9 mg/dL (ref 8.4–10.5)
Cholesterol: 234 mg/dL — ABNORMAL HIGH (ref 0–200)
Folate: 14.8 ng/mL
GFR calc non Af Amer: 111.45 mL/min (ref >60.00)
Glucose, Bld: 89 mg/dL (ref 70–99)
HDL: 58.2 mg/dL (ref >39.00)
MCV: 90.3 fL (ref 78.0–100.0)
Neutro Abs: 3 10*3/uL (ref 1.4–7.7)
Neutrophils Relative %: 63.8 % (ref 43.0–77.0)
Potassium: 4.1 meq/L (ref 3.5–5.1)
RBC: 4.3 M/uL (ref 3.87–5.11)
RDW: 13.5 % (ref 11.5–14.6)
TSH: 1.35 microintl units/mL (ref 0.35–5.50)
Total CHOL/HDL Ratio: 4
VLDL: 22 mg/dL (ref 0.0–40.0)
Vit D, 25-Hydroxy: 45 ng/mL (ref 30–89)

## 2010-02-05 ENCOUNTER — Encounter: Payer: Self-pay | Admitting: Family Medicine

## 2010-02-18 ENCOUNTER — Other Ambulatory Visit: Payer: Self-pay | Admitting: Family Medicine

## 2010-02-18 ENCOUNTER — Ambulatory Visit
Admission: RE | Admit: 2010-02-18 | Discharge: 2010-02-18 | Payer: Self-pay | Source: Home / Self Care | Attending: Family Medicine | Admitting: Family Medicine

## 2010-02-18 LAB — CBC WITH DIFFERENTIAL/PLATELET
Basophils Absolute: 0 10*3/uL (ref 0.0–0.1)
Eosinophils Absolute: 0.2 10*3/uL (ref 0.0–0.7)
MCHC: 33.9 g/dL (ref 30.0–36.0)
MCV: 89.3 fl (ref 78.0–100.0)
Monocytes Relative: 6.8 % (ref 3.0–12.0)
Neutro Abs: 3.1 10*3/uL (ref 1.4–7.7)
RBC: 4.26 Mil/uL (ref 3.87–5.11)
RDW: 13.9 % (ref 11.5–14.6)

## 2010-02-22 ENCOUNTER — Ambulatory Visit
Admission: RE | Admit: 2010-02-22 | Discharge: 2010-02-22 | Payer: Self-pay | Source: Home / Self Care | Attending: Family Medicine | Admitting: Family Medicine

## 2010-02-22 ENCOUNTER — Encounter: Payer: Self-pay | Admitting: Family Medicine

## 2010-02-24 LAB — CONVERTED CEMR LAB
Eosinophils Absolute: 0 10*3/uL (ref 0.0–0.6)
GFR calc Af Amer: 143 mL/min
GFR calc non Af Amer: 118 mL/min
Glucose, Bld: 158 mg/dL — ABNORMAL HIGH (ref 70–99)
HCT: 28.3 % — ABNORMAL LOW (ref 36.0–46.0)
Monocytes Absolute: 0.1 10*3/uL — ABNORMAL LOW (ref 0.2–0.7)
Monocytes Relative: 2.9 % — ABNORMAL LOW (ref 3.0–11.0)
Neutrophils Relative %: 77.9 % — ABNORMAL HIGH (ref 43.0–77.0)
Platelets: 320 10*3/uL (ref 150–400)
Potassium: 3.9 meq/L (ref 3.5–5.1)

## 2010-02-26 NOTE — Progress Notes (Signed)
Summary: Checking pt status   ---- Converted from flag ---- ---- 12/24/2009 4:01 PM, Loreen Freud DO wrote: Dr Elberta Leatherwood sent me a flag asking if she was still a patient.   We are still getting her mammograms etc--- Would you call her and just make sure the mammograms etc are going to the correct Dr. and she is still a pt here or do we need to fax them somewhere else? ------------------------------  left message to call back.... Almeta Monas CMA Duncan Dull)  December 25, 2009 8:13 AM  mailbox full...Marland KitchenMarland KitchenMarland Kitchen Almeta Monas CMA Duncan Dull)  December 26, 2009 3:00 PM'  mailbox full..... Almeta Monas CMA Duncan Dull)  December 27, 2009 4:33 PM  Phone Note Call from Patient      PT is still a current pt and has pending OV with dr Laury Axon on 01-29-10.Felecia Deloach CMA  December 28, 2009 4:27 PM   Pt does not have appointment until 11/30/2010?  yrlowne Dec 30, 2009 5pm

## 2010-02-28 NOTE — Letter (Signed)
Summary: Primary Care Appointment Letter  [facility] [place]  [address] [place], [place] [address]   Phone: [phone]  Fax: [phone]    02/05/2010 MRN: [id number]    [facility] [address] [place], [place]  [address]     Dear Ms. Atkison,    Your Primary Care Physician Loreen Freud DO has indicated that:    _______it is time to schedule an appointment.    _______you missed your appointment on______ and need to call and          reschedule.    _______you need to have lab work done.    ___x___you need to call the office to discuss lab or test results.    _______you need to call to reschedule your appointment that is                       scheduled on _________.     Please call our office as soon as possible. Our phone number is [phone]. Please press option 1. Our office is open 8a-5p, Monday through Friday.     Thank you,    [facility] Scheduler

## 2010-02-28 NOTE — Letter (Signed)
Summary: Primary Care Consult Scheduled Letter  [facility] [place]  [address] [place], [place] [address]   Phone: [phone]  Fax: [phone]      01/30/2010 MRN: [id number]  [facility] [address] [place], [place]  [address]    Dear [name] [name],    We have scheduled an appointment for you.  At the recommendation of Dr. Loreen Freud, we have scheduled you  for a Bone Density Scan with [facility] on 02-22-2010 at 9:00am.  Their address is [address] [place], [place] [address]. The office phone number is [phone].  If this appointment day and time is not convenient for you, please feel free to call the office of the doctor you are being referred to at the number listed above and reschedule the appointment.    It is important for you to keep your scheduled appointments. We are here to make sure you are given good patient care.   Thank you,    Renee, Patient Care Coordinator [facility] [place] [facility]

## 2010-02-28 NOTE — Miscellaneous (Signed)
Summary: BONE DENSITY  Clinical Lists Changes  Orders: Added new Test order of T-Bone Densitometry (77080) - Signed Added new Test order of T-Lumbar Vertebral Assessment (77082) - Signed 

## 2010-02-28 NOTE — Progress Notes (Signed)
Summary: Results-- lmom 1/4-1/6-1/9  Phone Note Outgoing Call   Call placed by: Almeta Monas CMA Duncan Dull),  January 30, 2010 1:48 PM Call placed to: Patient Details for Reason: con't mult vita and calcium with D -----normal level platelets are low----repeat cbcd  288.8 Ideally your LDL (bad cholesterol) should be <_100___, your HDL (good cholesterol) should be >_50__ and your triglycerides should be< 150.  Diet and exercise will increase HDL and decrease the LDL and triglycerides. Read Dr. Danice Goltz book--Eat Drink and Be Healthy.  We will recheck labs in __3-6_ months.   272.4  lipid hep  Summary of Call: Left message to call back.... Almeta Monas CMA Duncan Dull)  January 30, 2010 1:50 PM Left message to call back.... Almeta Monas CMA Duncan Dull)  February 01, 2010 4:47 PM Left message to call back... Almeta Monas CMA Duncan Dull)  February 04, 2010 1:16 PM  Tried contacting patient, will mail a letter for patient to contact office.... Almeta Monas CMA Duncan Dull)  February 05, 2010 11:52 AM      Appended Document: Results-- lmom 1/4-1/6-1/9 Pt return call and is aware of result and repeat labs scheduled.

## 2010-02-28 NOTE — Assessment & Plan Note (Signed)
Summary: CPX,FASTING,BCBS INS,? ADHD/RH......Marland Kitchen   Vital Signs:  Patient profile:   45 year old female Height:      65.5 inches Weight:      143.4 pounds BMI:     23.59 Temp:     98.6 degrees F oral Pulse rate:   60 / minute Pulse rhythm:   regular BP sitting:   108 / 74  (right arm)  Vitals Entered By: Almeta Monas CMA Duncan Dull) (January 29, 2010 8:29 AM) CC: CPX/Fasting--no pap--request breast exam   History of Present Illness: Pt here for cpe and breast exam.  No complaints.    Preventive Screening-Counseling & Management  Alcohol-Tobacco     Alcohol drinks/day: 3     Alcohol type: wine     Smoking Status: quit     Year Quit: 2004     Pack years: 20     Passive Smoke Exposure: no  Caffeine-Diet-Exercise     Caffeine use/day: 1     Does Patient Exercise: yes     Type of exercise: walk, weights     Times/week: 3  Hep-HIV-STD-Contraception     HIV Risk: no     Dental Visit-last 6 months yes     Dental Care Counseling: not indicated; dental care within six months     Sun Exposure-Excessive: rarely  Safety-Violence-Falls     Seat Belt Use: 100      Sexual History:  currently monogamous.        Drug Use:  no.    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 1 Each Day 2)  Multi-Day   Tabs (Multiple Vitamin) .... 1/day 3)  Actonel 150 Mg  Tabs (Risedronate Sodium)  .... Take One Tablet Weekly 4)  Tandem Plus 162-115.2-1 Mg  Caps (Fefum-Fepo-Fa-B Cmp-C-Zn-Mn-Cu) .... Take One Capsule By Mouth Daily 5)  Vitamin D 16109 Unit  Caps (Ergocalciferol) .Marland Kitchen.. 1 By Mouth Q Week X 4  Current Medications (verified): 1)  Multi-Day   Tabs (Multiple Vitamin) .... 1/day 2)  Caltrate 600+d Plus 600-400 Mg-Unit Chew (Calcium Carbonate-Vit D-Min) .Marland Kitchen.. 1 By Mouth Two Times A Day  Allergies (verified): 1)  ! Asa 2)  ! 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 adopted Mother side----mult types of cA  F-COPD   Social History: Reviewed history from 02/01/2008 and no changes required. 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 Eyes:  Denies blurring, discharge, double vision, eye irritation, eye pain, halos, itching, light sensitivity, red eye,  vision loss-1 eye, and vision loss-both eyes; optho q1y--+ contacts. ENT:  Denies decreased hearing, difficulty swallowing, ear discharge, earache, hoarseness, nasal congestion, nosebleeds, postnasal drainage, ringing in ears, sinus pressure, and sore throat. CV:  Denies bluish discoloration of lips or nails, chest pain or discomfort, difficulty breathing at night, difficulty breathing while lying down, fainting, fatigue, leg cramps with exertion, lightheadness, near fainting, palpitations, shortness of breath with exertion, swelling of feet, swelling of hands, and weight gain. Resp:  Denies chest discomfort, chest pain with inspiration, cough, coughing up blood, excessive snoring, hypersomnolence, morning headaches, pleuritic, shortness of breath, sputum productive, and wheezing. GI:  Denies abdominal pain, bloody stools, change in bowel habits, constipation, dark tarry stools, diarrhea, excessive appetite, gas, hemorrhoids, indigestion, loss of appetite, nausea, vomiting, vomiting blood, and yellowish skin color. GU:  Denies abnormal vaginal bleeding, decreased libido, discharge, dysuria, genital sores, hematuria, incontinence, nocturia, urinary frequency, and urinary hesitancy. MS:  Denies joint pain, joint redness, joint swelling, loss of strength, low back pain, mid back pain, muscle aches, muscle , cramps, muscle weakness, stiffness, and thoracic pain. Derm:  Denies changes in color of skin, changes in nail beds, dryness, excessive perspiration, flushing, hair loss, insect bite(s), itching, lesion(s), poor wound healing, and rash. Neuro:  Denies brief paralysis, difficulty with concentration, disturbances in coordination, falling down, headaches, inability to speak, memory loss, numbness, poor balance, seizures, sensation of room spinning, tingling, tremors, visual disturbances, and weakness. Psych:  Denies alternate hallucination ( auditory/visual), anxiety, depression, easily angered, easily  tearful, irritability, mental problems, panic attacks, sense of great danger, suicidal thoughts/plans, thoughts of violence, unusual visions or sounds, and thoughts /plans of harming others. Endo:  Denies cold intolerance, excessive hunger, excessive thirst, excessive urination, heat intolerance, polyuria, and weight change. Heme:  Denies abnormal bruising, bleeding, enlarge lymph nodes, fevers, pallor, and skin discoloration. Allergy:  Denies hives or rash, itching eyes, persistent infections, seasonal allergies, and sneezing.  Physical Exam  General:  Well-developed,well-nourished,in no acute distress; alert,appropriate and cooperative throughout examination Head:  Normocephalic and atraumatic without obvious abnormalities. No apparent alopecia or balding. Eyes:  pupils equal, pupils round, pupils reactive to light, and no injection.   Ears:  External ear exam shows no significant lesions or deformities.  Otoscopic examination reveals clear canals, tympanic membranes are intact bilaterally without bulging, retraction, inflammation or discharge. Hearing is grossly normal bilaterally. Nose:  External nasal examination shows no deformity or inflammation. Nasal mucosa are pink and moist without lesions or exudates. Mouth:  Oral mucosa and oropharynx without lesions or exudates.  Teeth in good repair. Neck:  No deformities, masses, or tenderness noted. Chest Wall:  No deformities, masses, or tenderness noted. Breasts:  No mass, nodules, thickening, tenderness, bulging, retraction, inflamation, nipple discharge or skin changes noted.   Lungs:  Normal respiratory effort, chest expands symmetrically. Lungs are clear to auscultation, no crackles or wheezes. Heart:  normal rate and no murmur.   Abdomen:  Bowel sounds positive,abdomen soft and non-tender without masses, organomegaly  or hernias noted. Rectal:  gyn Genitalia:  gyn Msk:  normal ROM, no joint tenderness, no joint swelling, no joint warmth,  no redness over joints, no joint deformities, no joint instability, and no crepitation.   Pulses:  R posterior tibial normal, R dorsalis pedis normal, R carotid normal, L posterior tibial normal, L dorsalis pedis normal, and L carotid normal.   Extremities:  No clubbing, cyanosis, edema, or deformity noted with normal full range of motion of all joints.   Neurologic:  No cranial nerve deficits noted. Station and gait are normal. Plantar reflexes are down-going bilaterally. DTRs are symmetrical throughout. Sensory, motor and coordinative functions appear intact. Skin:  Intact without suspicious lesions or rashes Cervical Nodes:  No lymphadenopathy noted Axillary Nodes:  No palpable lymphadenopathy Psych:  Cognition and judgment appear intact. Alert and cooperative with normal attention span and concentration. No apparent delusions, illusions, hallucinations   Impression & Recommendations:  Problem # 1:  PREVENTIVE HEALTH CARE (ICD-V70.0) GHM utd   Orders: Venipuncture (83151) TLB-B12 + Folate Pnl (76160_73710-G26/RSW) TLB-Lipid Panel (80061-LIPID) TLB-BMP (Basic Metabolic Panel-BMET) (80048-METABOL) TLB-CBC Platelet - w/Differential (85025-CBCD) TLB-Hepatic/Liver Function Pnl (80076-HEPATIC) TLB-TSH (Thyroid Stimulating Hormone) (84443-TSH) T-Vitamin D (25-Hydroxy) (54627-03500) Specimen Handling (93818) UA Dipstick W/ Micro (manual) (81000) EKG w/ Interpretation (93000)  Problem # 2:  OSTEOPOROSIS (ICD-733.00)  The following medications were removed from the medication list:    Actonel 150 Mg Tabs (Risedronate sodium) .Marland Kitchen... Take one tablet weekly    Vitamin D 29937 Unit Caps (Ergocalciferol) .Marland Kitchen... 1 by mouth q week x 4 Her updated medication list for this problem includes:    Caltrate 600+d Plus 600-400 Mg-unit Chew (Calcium carbonate-vit d-min) .Marland Kitchen... 1 by mouth two times a day  Orders: Venipuncture (16967) TLB-B12 + Folate Pnl (89381_01751-W25/ENI) TLB-Lipid Panel  (80061-LIPID) TLB-BMP (Basic Metabolic Panel-BMET) (80048-METABOL) TLB-CBC Platelet - w/Differential (85025-CBCD) TLB-Hepatic/Liver Function Pnl (80076-HEPATIC) TLB-TSH (Thyroid Stimulating Hormone) (84443-TSH) T-Vitamin D (25-Hydroxy) (77824-23536) Radiology Referral (Radiology) Specimen Handling (14431) EKG w/ Interpretation (93000)  Vit D:25 (02/02/2008), 28 (01/20/2007)  Problem # 3:  UNSPECIFIED ANEMIA (ICD-285.9)  The following medications were removed from the medication list:    Tandem Plus 162-115.2-1 Mg Caps (Fefum-fepo-fa-b cmp-c-zn-mn-cu) .Marland Kitchen... Take one capsule by mouth daily  Hgb: 8.1 (02/02/2008)   Hct: 26.0 (02/02/2008)   Platelets: 176 (02/02/2008) RBC: 3.88 (02/02/2008)   RDW: 16.0 (02/02/2008)   WBC: 4.6 (02/02/2008) MCV: 66.8 (02/02/2008)   MCHC: 31.3 (02/02/2008) Ferritin: 2.6 (02/02/2008) Iron: 47 (01/19/2007)   % Sat: 9.6 (01/19/2007) B12: 442 (02/02/2008)   Folate: 7.1 (01/19/2007)   TSH: 1.51 (04/01/2007)  Problem # 4:  UNIVERSAL ULCERATIVE COLITIS (ICD-556.6) Assessment: Improved  Complete Medication List: 1)  Multi-day Tabs (Multiple vitamin) .... 1/day 2)  Caltrate 600+d Plus 600-400 Mg-unit Chew (Calcium carbonate-vit d-min) .Marland Kitchen.. 1 by mouth two times a day   Orders Added: 1)  Venipuncture [36415] 2)  TLB-B12 + Folate Pnl [82746_82607-B12/FOL] 3)  TLB-Lipid Panel [80061-LIPID] 4)  TLB-BMP (Basic Metabolic Panel-BMET) [80048-METABOL] 5)  TLB-CBC Platelet - w/Differential [85025-CBCD] 6)  TLB-Hepatic/Liver Function Pnl [80076-HEPATIC] 7)  TLB-TSH (Thyroid Stimulating Hormone) [84443-TSH] 8)  T-Vitamin D (25-Hydroxy) [54008-67619] 9)  Radiology Referral [Radiology] 10)  Specimen Handling [99000] 11)  UA Dipstick W/ Micro (manual) [81000] 12)  Est. Patient 40-64 years [99396] 13)  EKG w/ Interpretation [93000]    Flu Vaccine Result Date:  11/07/2009 Flu Vaccine Result:  given Flu Vaccine Next Due:  1 yr Mammogram Result Date:   03/07/2009 Mammogram Result:  normal Mammogram  Next Due:  1 yr     Laboratory Results   Urine Tests   Date/Time Reported: January 29, 2010 9:47 AM   Routine Urinalysis   Color: yellow Appearance: Clear Glucose: negative   (Normal Range: Negative) Bilirubin: negative   (Normal Range: Negative) Ketone: smal (15)   (Normal Range: Negative) Spec. Gravity: 1.010   (Normal Range: 1.003-1.035) Blood: negative   (Normal Range: Negative) pH: 5.0   (Normal Range: 5.0-8.0) Protein: negative   (Normal Range: Negative) Urobilinogen: negative   (Normal Range: 0-1) Nitrite: negative   (Normal Range: Negative) Leukocyte Esterace: negative   (Normal Range: Negative)    Comments: Floydene Flock  January 29, 2010 9:47 AM

## 2010-03-06 ENCOUNTER — Ambulatory Visit (INDEPENDENT_AMBULATORY_CARE_PROVIDER_SITE_OTHER): Payer: BC Managed Care – PPO | Admitting: Psychiatry

## 2010-03-06 DIAGNOSIS — F411 Generalized anxiety disorder: Secondary | ICD-10-CM

## 2010-03-21 ENCOUNTER — Ambulatory Visit (INDEPENDENT_AMBULATORY_CARE_PROVIDER_SITE_OTHER): Payer: BC Managed Care – PPO | Admitting: Family Medicine

## 2010-03-21 ENCOUNTER — Other Ambulatory Visit: Payer: Self-pay | Admitting: Family Medicine

## 2010-03-21 ENCOUNTER — Encounter: Payer: Self-pay | Admitting: Family Medicine

## 2010-03-21 DIAGNOSIS — K921 Melena: Secondary | ICD-10-CM | POA: Insufficient documentation

## 2010-03-21 DIAGNOSIS — R1013 Epigastric pain: Secondary | ICD-10-CM

## 2010-03-21 DIAGNOSIS — A09 Infectious gastroenteritis and colitis, unspecified: Secondary | ICD-10-CM

## 2010-03-21 DIAGNOSIS — R109 Unspecified abdominal pain: Secondary | ICD-10-CM | POA: Insufficient documentation

## 2010-03-21 LAB — BASIC METABOLIC PANEL
CO2: 29 mEq/L (ref 19–32)
Calcium: 8.2 mg/dL — ABNORMAL LOW (ref 8.4–10.5)
Creatinine, Ser: 0.7 mg/dL (ref 0.4–1.2)
Glucose, Bld: 109 mg/dL — ABNORMAL HIGH (ref 70–99)
Sodium: 134 mEq/L — ABNORMAL LOW (ref 135–145)

## 2010-03-21 LAB — CBC WITH DIFFERENTIAL/PLATELET
Basophils Absolute: 0.1 10*3/uL (ref 0.0–0.1)
Basophils Relative: 0.6 % (ref 0.0–3.0)
Eosinophils Relative: 1.1 % (ref 0.0–5.0)
Hemoglobin: 12.6 g/dL (ref 12.0–15.0)
Lymphocytes Relative: 12.2 % (ref 12.0–46.0)
Platelets: 248 10*3/uL (ref 150.0–400.0)
RBC: 4.21 Mil/uL (ref 3.87–5.11)

## 2010-03-21 LAB — HEPATIC FUNCTION PANEL
AST: 18 U/L (ref 0–37)
Alkaline Phosphatase: 55 U/L (ref 39–117)

## 2010-03-21 LAB — LIPASE: Lipase: 18 U/L (ref 11.0–59.0)

## 2010-03-22 ENCOUNTER — Encounter (INDEPENDENT_AMBULATORY_CARE_PROVIDER_SITE_OTHER): Payer: Self-pay | Admitting: *Deleted

## 2010-03-22 DIAGNOSIS — R109 Unspecified abdominal pain: Secondary | ICD-10-CM

## 2010-03-23 ENCOUNTER — Encounter: Payer: Self-pay | Admitting: Family Medicine

## 2010-03-26 ENCOUNTER — Telehealth: Payer: Self-pay | Admitting: Family Medicine

## 2010-03-26 NOTE — Assessment & Plan Note (Signed)
Summary: [place]/stomach problems now/kn   Vital Signs:  Patient profile:   45 year old female Weight:      130.6 pounds Temp:     97.6 degrees F oral BP sitting:   108 / 62  (right arm) Cuff size:   regular  Vitals Entered By: Almeta Monas CMA Duncan Dull) (March 21, 2010 11:12 AM) CC: c/o stomach bug since returning from [place] x1week ago   History of Present Illness: Pt here c/o NVD since coming back from [facility] got sick.  Pt has hx IBS and is worse than the others.    Current Medications (verified): 1)  Multi-Day   Tabs (Multiple Vitamin) .... 1/day 2)  Caltrate 600+d Plus 600-400 Mg-Unit Chew (Calcium Carbonate-Vit D-Min) .Marland Kitchen.. 1 By Mouth Two Times A Day 3)  Promethazine Hcl 25 Mg Tabs (Promethazine Hcl) .Marland Kitchen.. 1 By Mouth Qid As Needed 4)  Cipro 500 Mg Tabs (Ciprofloxacin Hcl) .Marland Kitchen.. 1 By Mouth Two Times A Day For 3 Days  Allergies (verified): 1)  ! Asa 2)  ! 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 adopted Mother side----mult types of cA F-COPD   Social History: Reviewed history from 02/01/2008 and no changes  required. 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   Mouth:  MM dry Neck:  No deformities, masses, or tenderness noted. Abdomen:  soft.  Some epigastric tenderness Rectal:  no external abnormalities and stool positive for occult blood.   Psych:  Cognition and judgment appear intact. Alert and cooperative with normal attention span and concentration. 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 Call if worsening of symptoms or signs of dehydration.   Problem # 3:  IBD (ICD-558.9)  Orders: Gastroenterology Referral (GI)  Discussed use of medication and role of diet. Encouraged clear liquids and electrolyte replacement fluids. Instructed to call if any signs of worsening dehydration.   Complete Medication List: 1)  Multi-day Tabs (Multiple vitamin) .... 1/day 2)  Caltrate 600+d Plus 600-400 Mg-unit Chew (Calcium carbonate-vit d-min) .Marland Kitchen.. 1 by mouth two times a day 3)  Promethazine Hcl 25 Mg Tabs (Promethazine hcl) .Marland Kitchen.. 1 by mouth qid as needed 4)  Cipro 500 Mg Tabs (Ciprofloxacin hcl) .Marland Kitchen.. 1 by mouth two times a day  for 3 days Prescriptions: CIPRO 500 MG TABS  (CIPROFLOXACIN HCL) 1 by mouth two times a day for 3 days  #6 x 0   Entered and Authorized by:   Loreen Freud DO   Signed by:   Loreen Freud DO on 03/21/2010   Method used:   Electronically to        [facility]* (retail)       [address] [place], [place]  [address]       Ph: [phone]       Fax: [phone]   RxID:   [id number] PROMETHAZINE HCL 25 MG TABS (PROMETHAZINE HCL) 1 by mouth qid as needed  #30 x 0   Entered and Authorized by:   Loreen Freud DO   Signed by:   Loreen Freud DO on 03/21/2010   Method used:   Electronically to        [facility]* (retail)       [address] [place], [place]  [address]       Ph: [phone]       Fax: [phone]   RxID:   [id number]    Orders Added: 1)  Venipuncture [25427] 2)  TLB-BMP (Basic Metabolic Panel-BMET) [80048-METABOL] 3)  TLB-CBC Platelet - w/Differential [85025-CBCD] 4)  TLB-Hepatic/Liver Function Pnl [80076-HEPATIC] 5)  TLB-Amylase [82150-AMYL] 6)  TLB-Lipase [83690-LIPASE] 7)  T-Culture, Stool [87045/87046-70140] 8)  T-Stool for O&P [87177-70555] 9)  T-Stool Giardia / Crypto- EIA [06237] 10)  Gastroenterology Referral [GI] 11)  Specimen Handling [99000] 12)  Est. Patient Level III [62831]

## 2010-03-26 NOTE — Letter (Signed)
Summary: New Patient letter  [facility]  [address]   [place], [place] [address]   Phone: [phone]  Fax: [phone]       03/22/2010 MRN: [id number]  [facility] [address] [place], [place]  [address]  [place]  Dear [name] [name],  Welcome to the Gastroenterology Division at [facility].    You are scheduled to see Dr.  Leone Payor on 04/30/2010 at 8:45 on the 3rd floor at [facility], [address]  We ask that you try to arrive at our office 15 minutes prior to your appointment time to allow for check-in.  We would like you to complete the enclosed self-administered evaluation form prior to your visit and bring it with you on the day of your appointment.  We will review it with you.  Also, please bring a complete list of all your medications or, if you prefer, bring the medication bottles and we will list them.  Please bring your insurance card so that we may make a copy of it.  If your insurance requires a referral to see a specialist, please bring your referral form from your primary care physician.  Co-payments are due at the time of your visit and may be paid by cash, check or credit card.     Your office visit will consist of a consult with your physician (includes a physical exam), any laboratory testing he/she may order, scheduling of any necessary diagnostic testing (e.g. x-ray, ultrasound, CT-scan), and scheduling of a procedure (e.g. Endoscopy, Colonoscopy) if required.  Please allow enough time on your schedule to allow for any/all of these possibilities.    If you cannot keep your appointment, please call [phone] to cancel or reschedule prior to your appointment date.  This allows [place] the opportunity to schedule an appointment for another patient in need of care.  If you do not cancel or reschedule by 5 p.m. the business day prior to your appointment date, you will be charged a $50.00 late cancellation/no-show fee.    Thank you for  choosing [facility] for your medical needs.  We appreciate the opportunity to care for you.  Please visit [place] at our website  to learn more about our practice.                     Sincerely,                                                             The Gastroenterology Division

## 2010-03-27 ENCOUNTER — Telehealth: Payer: Self-pay | Admitting: Internal Medicine

## 2010-03-28 ENCOUNTER — Encounter: Payer: Self-pay | Admitting: Nurse Practitioner

## 2010-03-28 ENCOUNTER — Ambulatory Visit (INDEPENDENT_AMBULATORY_CARE_PROVIDER_SITE_OTHER): Payer: BC Managed Care – PPO | Admitting: Nurse Practitioner

## 2010-03-28 ENCOUNTER — Encounter: Payer: Self-pay | Admitting: Internal Medicine

## 2010-03-28 DIAGNOSIS — R634 Abnormal weight loss: Secondary | ICD-10-CM | POA: Insufficient documentation

## 2010-03-28 DIAGNOSIS — K5190 Ulcerative colitis, unspecified, without complications: Secondary | ICD-10-CM

## 2010-03-29 ENCOUNTER — Encounter: Payer: Self-pay | Admitting: Nurse Practitioner

## 2010-03-29 ENCOUNTER — Inpatient Hospital Stay (HOSPITAL_COMMUNITY)
Admission: RE | Admit: 2010-03-29 | Discharge: 2010-04-02 | DRG: 179 | Disposition: A | Discharge status: Home / Self Care | Payer: BC Managed Care – PPO | Source: Ambulatory Visit | Attending: Internal Medicine | Admitting: Internal Medicine

## 2010-03-29 ENCOUNTER — Other Ambulatory Visit: Payer: BC Managed Care – PPO | Admitting: Internal Medicine

## 2010-03-29 ENCOUNTER — Other Ambulatory Visit: Payer: Self-pay | Admitting: Internal Medicine

## 2010-03-29 DIAGNOSIS — E860 Dehydration: Secondary | ICD-10-CM

## 2010-03-29 DIAGNOSIS — E876 Hypokalemia: Secondary | ICD-10-CM

## 2010-03-29 DIAGNOSIS — K5100 Ulcerative (chronic) pancolitis without complications: Secondary | ICD-10-CM

## 2010-03-29 HISTORY — PX: SIGMOIDOSCOPY: SUR1295

## 2010-03-29 LAB — CBC
HCT: 33.7 % — ABNORMAL LOW (ref 36.0–46.0)
MCH: 28.2 pg (ref 26.0–34.0)
MCV: 84.9 fL (ref 78.0–100.0)
RDW: 12.5 % (ref 11.5–15.5)
WBC: 6.6 10*3/uL (ref 4.0–10.5)

## 2010-03-29 LAB — COMPREHENSIVE METABOLIC PANEL
Alkaline Phosphatase: 55 U/L (ref 39–117)
BUN: 7 mg/dL (ref 6–23)
Creatinine, Ser: 0.63 mg/dL (ref 0.4–1.2)
Glucose, Bld: 116 mg/dL — ABNORMAL HIGH (ref 70–99)
Potassium: 2.5 mEq/L — CL (ref 3.5–5.1)
Total Bilirubin: 0.5 mg/dL (ref 0.3–1.2)
Total Protein: 5 g/dL — ABNORMAL LOW (ref 6.0–8.3)

## 2010-03-30 LAB — BASIC METABOLIC PANEL
CO2: 25 mEq/L (ref 19–32)
Calcium: 7.3 mg/dL — ABNORMAL LOW (ref 8.4–10.5)
GFR calc Af Amer: >60 mL/min (ref >60)
GFR calc non Af Amer: >60 mL/min (ref >60)
Sodium: 135 mEq/L (ref 135–145)

## 2010-03-31 LAB — GLUCOSE, CAPILLARY: Glucose-Capillary: 152 mg/dL — ABNORMAL HIGH (ref 70–99)

## 2010-04-01 ENCOUNTER — Telehealth (INDEPENDENT_AMBULATORY_CARE_PROVIDER_SITE_OTHER): Payer: Self-pay | Admitting: *Deleted

## 2010-04-01 LAB — BASIC METABOLIC PANEL
CO2: 25 mEq/L (ref 19–32)
Calcium: 7.5 mg/dL — ABNORMAL LOW (ref 8.4–10.5)
Creatinine, Ser: 0.61 mg/dL (ref 0.4–1.2)
GFR calc non Af Amer: >60 mL/min (ref >60)
Glucose, Bld: 115 mg/dL — ABNORMAL HIGH (ref 70–99)
Sodium: 137 mEq/L (ref 135–145)

## 2010-04-01 LAB — CBC
HCT: 24.4 % — ABNORMAL LOW (ref 36.0–46.0)
Hemoglobin: 7.8 g/dL — ABNORMAL LOW (ref 12.0–15.0)
MCH: 27.9 pg (ref 26.0–34.0)
MCHC: 32 g/dL (ref 30.0–36.0)
RDW: 13.1 % (ref 11.5–15.5)

## 2010-04-01 LAB — DIFFERENTIAL
Basophils Absolute: 0.1 10*3/uL (ref 0.0–0.1)
Eosinophils Relative: 0 % (ref 0–5)
Lymphs Abs: 0.9 10*3/uL (ref 0.7–4.0)
Monocytes Absolute: 0.3 10*3/uL (ref 0.1–1.0)
Monocytes Relative: 6 % (ref 3–12)
Neutro Abs: 4.4 10*3/uL (ref 1.7–7.7)

## 2010-04-01 LAB — HEPATITIS A ANTIBODY, TOTAL: Hep A Total Ab: NEGATIVE

## 2010-04-01 LAB — HEPATITIS B SURFACE ANTIBODY,QUALITATIVE: Hep B S Ab: NEGATIVE

## 2010-04-01 LAB — VITAMIN B12: Vitamin B-12: 1146 pg/mL — ABNORMAL HIGH (ref 211–911)

## 2010-04-01 LAB — FOLATE: Folate: 10.6 ng/mL

## 2010-04-02 DIAGNOSIS — R112 Nausea with vomiting, unspecified: Secondary | ICD-10-CM | POA: Diagnosis present

## 2010-04-02 DIAGNOSIS — K5180 Other ulcerative colitis without complications: Principal | ICD-10-CM | POA: Diagnosis present

## 2010-04-02 DIAGNOSIS — K5100 Ulcerative (chronic) pancolitis without complications: Secondary | ICD-10-CM

## 2010-04-02 DIAGNOSIS — D500 Iron deficiency anemia secondary to blood loss (chronic): Secondary | ICD-10-CM | POA: Diagnosis present

## 2010-04-02 DIAGNOSIS — E876 Hypokalemia: Secondary | ICD-10-CM | POA: Diagnosis present

## 2010-04-04 NOTE — Assessment & Plan Note (Addendum)
Summary: Hx of Crohn's, diarrhea , lack of appetite     History of Present Illness Visit Type: Initial Consult Primary GI MD: Stan Head MD [facility] Primary Provider: Loreen Freud, DO Requesting Provider: Loreen Freud, DO Chief Complaint: Hx of Crohn's, patient reently had travelers diarrhea, loss of appetite History of Present Illness:   Patient is a 45 year old female with history of ulcerative colitis. She was last seen  by Dr. Gessner Jan. 2010  at which time she was doing better on Azathioprine. Shortly after that visit the patient took herself off Azathioprine because she was feeling better.Except for a few minor flares patient tells me that she had been doing relatively well until about 2 weeks ago when she traveled to [place]  developed traveler's diarrhea after returning home. Her primary care doctor treated her with a week of Cipro which did help. Initially her stools were frequent and large volume. Now still having frequent stools but of much smaller volume. Over the last day or so she has developed some blood when she wipes. No abdominal pain but very nauseated. She describes significant weight loss (20 pounds) over the last several days. Patient is scared that her condition is deteriorating. She is able to hold down water and is trying to drink some Ensure but other than that she has no appetite. Doubts fever. Vomited once yesterday.      GI Review of Systems    Reports loss of appetite, nausea, and  vomiting.      Denies abdominal pain, acid reflux, belching, bloating, chest pain, dysphagia with liquids, dysphagia with solids, heartburn, vomiting blood, weight loss, and  weight gain.      Reports diarrhea and  rectal bleeding.     Denies anal fissure, black tarry stools, change in bowel habit, constipation, diverticulosis, fecal incontinence, heme positive stool, hemorrhoids, irritable bowel syndrome, jaundice, light color stool, liver problems, and  rectal pain.    Current  Medications (verified): 1)  Multi-Day   Tabs (Multiple Vitamin) .... 1/day 2)  Caltrate 600+d Plus 600-400 Mg-Unit Chew (Calcium Carbonate-Vit D-Min) .Marland Kitchen.. 1 By Mouth Two Times A Day 3)  Promethazine Hcl 25 Mg Tabs (Promethazine Hcl) .Marland Kitchen.. 1 By Mouth Qid As Needed 4)  Klor-Con M20 20 Meq Cr-Tabs (Potassium Chloride Crys Cr) .... Take 1 By Mouth Once Daily  Allergies (verified): 1)  ! Asa 2)  ! Penicillin  Past History:  Past Medical History: Reviewed history from 02/01/2008 and no changes required. 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 Wisdom teeth Spontaeous pneumothorax VATS  Family History: Reviewed history from 03/19/2007 and no changes required. adopted Mother side----mult types of cA F-COPD   Social History: Reviewed history from 02/01/2008 and no changes required. 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  The patient denies allergy/sinus, anemia, anxiety-new, arthritis/joint pain, back pain, blood in urine, breast changes/lumps, change in vision, confusion, cough, coughing up blood, depression-new, fainting, fever, headaches-new, hearing problems, heart murmur, heart rhythm changes, itching, menstrual pain, muscle pains/cramps, night sweats, nosebleeds, pregnancy symptoms, shortness of breath, skin rash, sleeping problems, sore throat, swelling of feet/legs, swollen lymph glands, thirst - excessive , urination - excessive , urination changes/pain, urine leakage, vision changes, and voice change.  Vital Signs:  Patient profile:   45 year old female Height:      65.5 inches Weight:      122.50 pounds BMI:     20.15 Temp:      97.4 degrees F oral Pulse rate:   68 / minute Pulse rhythm:   regular BP supine:   90 / 60  (left arm) Cuff size:   regular  Vitals Entered By: June McMurray CMA Duncan Dull) (March 28, 2010 10:51 AM)  Physical Exam  General:  Thin white female, no acute distress. Head:  Normocephalic and atraumatic. Eyes:  Conjunctiva pink, no icterus.  Mouth:  No oral lesions. Tongue moist.  Neck:  Supple; no masses. Lungs:  Clear throughout to auscultation. Heart:  Regular rate and rhythm; no murmurs, rubs,  or bruits. Abdomen:  Abdomen soft, nontender, nondistended. No obvious masses or hepatomegaly.Normal bowel sounds.  Rectal:  No external lesions. On anscopy the anal canal is erythematous, slightly edematous.  Msk:  Symmetrical with no gross deformities. Normal posture. Extremities:  No palmar erythema, no edema.  Neurologic:  Alert and  oriented x4;  grossly normal neurologically. Skin:  Intact without significant lesions or rashes. Cervical Nodes:  No significant cervical adenopathy. Psych:  Alert and cooperative. Normal mood and affect.   Impression & Recommendations:  Problem # 1:  ULCERATIVE COLITIS (ICD-556.9) Assessment Deteriorated Patient had severe disease but finally came under control with Azathioprine. Unfortunately patient hasn't been seen here in two years. She stopped her medication shortly after her last visit in 2010 because of "feeling better". Now with weight loss, nausea and severe diarrhea after returning from [place].  Stool studies negative except for C. difficile which was not obtained. Recent labs show potassium 2.9 which has since been repleted by patient's PCP. CBC okay. Albumin low at 2.9. Alixandrea had a partial response to one week of Cipro. Since I do not know if this is an ulcerative colitis flare versus infectious colitis, I am reluctant to treat her empirically. Fortunately, her primary gastroenterologist Dr. Leone Payor has an opening on his schedule tomorrow for a  flexible sigmoidoscopy. I spoke with the patient and she is agreeable. The patient will be scheduled for a flexible sigmoidoscopy /biopsies (if inicated).  The risks and benefits of the procedure were discussed with the patient and she agrees to proceed.  Further recommendations will be made following the flexible sigmoidoscopy tomorrow.  Other Orders: ZFLEX (ZFL)  Patient Instructions: 1)  We have scheduled you a Flexible Sigmoidoscopy with Dr. Leone Payor at [facility] on the 1st floor. 2)  Directions and brochure provided. 3)  Copy sent to : Loreen Freud, DO 4)  The medication list was reviewed and reconciled.  All changed / newly prescribed medications were explained.  A complete medication list was provided to the patient / caregiver.

## 2010-04-04 NOTE — Letter (Signed)
Summary: [facility]  [address] [place], [place] [address]   Phone: [phone]  Fax: [phone]       ISSABELLE MCRANEY    17-Jul-1965    MRN: [id number]        Procedure Day /Date: 03-29-2010      Arrival Time: 8:00 AM      Procedure Time: 9:00 AM     Location of Procedure:                     X      [facility] ( Outpatient Registration)  PREPARATION FOR FLEXIBLE SIGMOIDOSCOPY WITH MAGNESIUM CITRATE  Prior to the day before your procedure, purchase one 8 oz. bottle of Magnesium Citrate and one Fleet Enema from the laxative section of your drugstore.  _________________________________________________________________________________________________  THE DAY BEFORE YOUR PROCEDURE             DATE: 03-28-2010      MWU:[id number]  1.   Have a clear liquid dinner the night before your procedure.  2.   Do not drink anything colored red or purple.  Avoid juices with pulp.  No orange juice.              CLEAR LIQUIDS INCLUDE: Water Jello Ice Popsicles Tea (sugar ok, no milk/cream) Powdered fruit flavored drinks Coffee (sugar ok, no milk/cream) Gatorade Juice: apple, white grape, white cranberry  Lemonade Clear bullion, consomm, broth Carbonated beverages (any kind) Strained chicken noodle soup Hard Candy   3.   At 7:00 pm the night before your procedure, drink one bottle of Magnesium Citrate over ice.  4.   Drink at least 3 more glasses of clear liquids before bedtime (preferably juices).  5.   Results are expected usually within 1 to 6 hours after taking the Magnesium Citrate.  ___________________________________________________________________________________________________  THE DAY OF YOUR PROCEDURE            DATE: 03-29-2010     DAY: Friday  1.   Use Fleet Enema one hour prior to coming for procedure.  2.   You may drink clear liquids until 5:00 Am ( 4 hours before exam)       MEDICATION INSTRUCTIONS  Unless  otherwise instructed, you should take regular prescription medications with a small sip of water as early as possible the morning of your procedure.        OTHER INSTRUCTIONS  You will need a responsible adult at least 44 years of age to accompany you and drive you home.   This person must remain in the waiting room during your procedure.  Wear loose fitting clothing that is easily removed.  Leave jewelry and other valuables at home.  However, you may wish to bring a book to read or an iPod/MP3 player to listen to music as you wait for your procedure to start.  Remove all body piercing jewelry and leave at home.  Total time from sign-in until discharge is approximately 2-3 hours.  You should go home directly after your procedure and rest.  You can resume normal activities the day after your procedure.  The day of your procedure you should not:   Drive   Make legal decisions   Operate machinery   Drink alcohol   Return to work  You will receive specific instructions about eating, activities and medications before you leave.   The above instructions have been reviewed and explained to me by   _______________________  I fully understand and can verbalize these instructions _____________________________ Date _________

## 2010-04-04 NOTE — Progress Notes (Signed)
Summary: triage   Phone Note Call from Patient Call back at [phone]   Caller: Patient Call For: Dr Leone Payor Reason for Call: Talk to Nurse Summary of Call: Patient wants to speak to nurse regarding her illness. Initial call taken by: Tawni Levy,  March 27, 2010 11:01 AM  Follow-up for Phone Call        Patient reports she had traveled to [place] last month and has had diarrhea since.  She has been treated by Dr Laury Axon for this , she now reports that she has no appetite.  She dnies any problems with her crohn's since 2010.  She will come in and see Willette Cluster RNP tomorrow at 10:30 Follow-up by: Darcey Nora RN, CGRN,  March 27, 2010 11:28 AM

## 2010-04-04 NOTE — Procedures (Addendum)
Summary: Flexible Sigmoidoscopy  Patient: [name] Note: All result statuses are Final unless otherwise noted.  Tests: (1) Flexible Sigmoidoscopy (FLX)  FLX Flexible Sigmoidoscopy                             DONE     [facility]     [address] [place], [place]  [address]          FLEXIBLE SIGMOIDOSCOPY PROCEDURE REPORT          PATIENT:  [name] [name], [name] [name]  MR#:  [id number]     BIRTHDATE:  03-21-1965, 44 yrs. old  GENDER:  female          ENDOSCOPIST:  Iva Boop, MD, [facility]          PROCEDURE DATE:  03/29/2010     PROCEDURE:  Flexible Sigmoidoscopy with biopsy     ASA CLASS:  Class II     INDICATIONS:  diarrhea, ulcerative colitis off therapy x 2 years     severe diarrhea after trip to [place] and others ill also (but     recovered)     stool Cx, O          MEDICATIONS:   Fentanyl 50 mcg, Versed 5 mg          DESCRIPTION OF PROCEDURE:   After the risks benefits and     alternatives of the procedure were thoroughly explained, informed     consent was obtained.  Digital rectal exam was performed and     revealed no abnormalities.   The  endoscope was introduced through     the anus and advanced to the sigmoid colon, without limitations.     The quality of the prep was Unprepped.  The instrument was then     slowly withdrawn as the mucosa was fully examined.     <<PROCEDUREIMAGES>>          Ulcerative colitis rectum to sigmoid Diffuse changes of edema,     granular and ulcerated mucosa with mild contact friability. No     pseudomembranes. Multiple biopsies were obtained and sent to     pathology.   Retroflexed views in the rectum revealed not     performed.    The scope was then withdrawn from the patient and     the procedure terminated.          COMPLICATIONS:  None          ENDOSCOPIC IMPRESSION:     1) Ulcerative colitis in the rectum to sigmoid - moderately     severe activity     RECOMMENDATIONS:     1) admit to rehydrate and replete  K (it was 2.9 this week and     she is unable to get adequate po intake including KCL)     2) IV steroids     3) watch for possible EtOH withdrawal as outpatient chart lists     3 drinks daily          Iva Boop, MD, Clementeen Graham          n.     eSIGNED:   Iva Boop at 03/29/2010 09:42 AM          [name] [name], [id number]  Note: An exclamation mark (!) indicates a result that was not dispersed into the flowsheet. Document Creation Date: 03/29/2010 9:43 AM _______________________________________________________________________  (1)  Order result status: Final Collection or observation date-time: 03/29/2010 09:35 Requested date-time:  Receipt date-time:  Reported date-time:  Referring Physician:   Ordering Physician: Stan Head [id number]) Specimen Source:  Source: Launa Grill Order Number: [id number] Lab site:

## 2010-04-04 NOTE — Progress Notes (Signed)
Summary: STILL NO APPETITE  Phone Note Call from Patient Call back at Home Phone [phone]   Summary of Call: Pt states that she is still nausea and lack appetite, despite  taking the phenergan. Pt would like to know how long before her appetite should return to normal. Pt notes that last time she ending up in hosp after weeks after forcing herself to eat. Pls advise.Marland KitchenMarland KitchenFelecia Deloach CMA  March 26, 2010 10:50 AM   Follow-up for Phone Call        she needs f/u GI but if she doesn't get better soon she should not hesitate to go to ER for IV----  Follow-up by: Loreen Freud DO,  March 26, 2010 11:31 AM  Additional Follow-up for Phone Call Additional follow up Details #1::        Pt aware.........Marland KitchenFelecia Deloach CMA  March 26, 2010 11:42 AM

## 2010-04-05 NOTE — H&P (Signed)
NAMEAUBRYANNA, [name]               ACCOUNT NO.:  [id number]  MEDICAL RECORD NO.:  [id number]           PATIENT TYPE:  O  LOCATION:  [facility]                         FACILITY:  [facility]  PHYSICIAN:  [name] Boop, MD,FACGDATE OF BIRTH:  Jul 01, 1965  DATE OF ADMISSION:  03/29/2010 DATE OF DISCHARGE:                             HISTORY & PHYSICAL   PROBLEM:  Ulcerative colitis with acute exacerbation.  HISTORY:  [name] [name] is a 45 year old female with history of ulcerative colitis.  She had not been seen in our office since January 2010 at which time she was being maintained on azathioprine.  Apparently shortly after that visit, she took her off the azathioprine because she had been feeling well.  This was not discussed with Dr. Leone [name].  The patient says with the exception of a few minor flares, she had been doing well until about 2 weeks ago after she returned from a vacation to [place]. She developed what she thought was traveler's diarrhea as did other people she had traveled with.  She was seen by Dr. Laury Axon, her primary care doctor, who treated her with a week's worth of Cipro which apparently did help somewhat.  However, her diarrhea did not resolve, just became smaller volume.  Now, she has developed bright red blood in her stools and nausea.  She describes a significant weight loss of approximately 20 pounds over the past couple of weeks.  She says she is so nauseated that she is having a hard time holding down fluids and have been trying to drink some Ensure but other than that had not eaten.  She does not feel that she has had any fever.  Again, appetite is very poor and she has been vomiting within the past 24-36 hours.  She was seen and evaluated in the office on March 1 per [name] Cluster, NP and at that time set up for flexible sigmoidoscopy this morning with Dr. Leone [name] to sort out whether her symptoms were due to exacerbation of her ulcerative colitis versus an infectious  diarrhea.  This morning, she underwent flexible sigmoidoscopy which shows active moderate to severe ulcerative colitis from the rectum to the sigmoid.  Decision is made to admit her to the hospital for hydration, repletion of her potassium as she is unable to take adequate p.o.'s at this time.  We will initiate her on IV steroids and for further details, please see the orders.  MEDICATIONS PRIOR TO ADMISSION:  Multivitamin daily, Caltrate 600 plus D, Phenergan 25 mg q.6h. as needed, Klor-Con 20 mEq 1 by mouth daily for recent potassium low at 2.9.  ALLERGIES/INTOLERANCES:  ASPIRIN AND PENICILLIN.  PAST HISTORY:  Pertinent for ulcerative colitis, history of B12 deficiency.  She had a spontaneous pneumothorax in August 2004, has history of osteoporosis with the pneumothorax per the VATS procedure.  SOCIAL HISTORY:  The patient is married.  She does have a children.  She is employed with a [employer].  She is a former smoker.  She does drink alcohol approximately 3 drinks per day.  FAMILY HISTORY:  Negative for inflammatory bowel disease.  The patient was adopted but  believe that her mother had cancer.  Father with COPD.  REVIEW OF SYSTEMS:  CARDIOVASCULAR:  Denies any chest pain or anginal symptoms. PULMONARY:  Negative for cough, shortness of breath or sputum production. GENITOURINARY:  Denies any dysuria, urgency or frequency. GASTROINTESTINAL:  As outlined above. MUSCULOSKELETAL:  Denied any arthritic symptoms, joint pain or muscle pain.  She does complain of generalized fatigue, nausea and weight loss as outlined above.  All other systems negative except as in HPI.  PHYSICAL EXAM:  GENERAL:  A well-developed white female in no acute distress.  Height is 5 feet 5, weight 122.  BMI of 20. VITAL SIGNS:  Temperature in the office yesterday was 97.4, blood pressure 90/60, pulse in the 70s. HEENT:  Nontraumatic, normocephalic.  EOMI, PERRLA.  Sclerae  anicteric. NECK:  Supple without nodes. LUNGS:  Clear to A and P. CARDIOVASCULAR:  Regular rate and rhythm with S1 and S2.  No murmur or gallop. PULMONARY:  Clear to A and P. ABDOMEN:  Soft, nontender, nondistended.  No palpable mass or hepatosplenomegaly.  Bowel sounds are active. RECTAL:  Exam not done on admission.  See flexible sigmoidoscopy report. NEUROLOGIC:  The patient has been sedated for procedure, is sleepy but exam grossly nonfocal. SKIN:  Intact without lesions or rashes. PSYCHIATRIC:  Cooperative.  Affect and mood appropriate.  IMPRESSION: 1. A 45 year old female with exacerbation of ulcerative colitis.  This     may have been superimposed on a traveler's diarrhea. 2. Hypokalemia secondary to above. 3. Nausea and vomiting probably secondary to above. 4. Daily EtOH use. 5. History for osteoporosis.  PLAN:  The patient is admitted to the service of [name] [name] for IV fluid hydration, antiemetics, IV PPI.  She will be placed on IV Solu- Medrol for her colitis.  We will replace her potassium IV and plan observation for any evidence of EtOH withdrawal with daily EtOH use. For details, please see the orders.     [name] Esterwood, PA-C   ______________________________ [name] Boop, MD,FACG    AE/MEDQ  D:  03/29/2010  T:  03/29/2010  Job:  [id number]  Electronically Signed by [name] ESTERWOOD PA-C on 04/02/2010 11:08:53 AM Electronically Signed by Stan [name] MDFACG on 04/05/2010 08:05:44 AM

## 2010-04-09 NOTE — Procedures (Signed)
Summary: Instructions for procedure/[place]  Instructions for procedure/[place]   Imported By: Sherian Rein 04/01/2010 07:56:23  _____________________________________________________________________  External Attachment:    Type:   Image     Comment:   External Document

## 2010-04-09 NOTE — Progress Notes (Signed)
Summary: Unable to void  ---- Converted from flag ---- ---- 03/21/2010 11:56 AM, Floydene Flock wrote: Pt could not void....will have hsb. return with her urine... ------------------------------

## 2010-04-15 NOTE — Discharge Summary (Signed)
[name] [name], [name] [name]               ACCOUNT NO.:  [id number]  MEDICAL RECORD NO.:  [id number]           PATIENT TYPE:  I  LOCATION:  1533                         FACILITY:  [facility]  PHYSICIAN:  Barbette Hair. Arlyce Dice, MD,FACGDATE OF BIRTH:  1965/02/25  DATE OF ADMISSION:  03/29/2010 DATE OF DISCHARGE:  04/02/2010                              DISCHARGE SUMMARY   ADMITTING DIAGNOSES: 48. A 45 year old female with acute exacerbation of ulcerative colitis,     possibly superimposed on a traveler's diarrhea. 2. Hypokalemia secondary to above. 3. Nausea and vomiting secondary to above. 4. History of osteoporosis. 5. History of daily EtOH.  DISCHARGE DIAGNOSES: 1. Acute exacerbation of ulcerative colitis, improving and stable. 2. Hypokalemia, resolved. 3. Nausea and vomiting, resolved. 4. Anemia, normocytic with no evidence for iron or B12 deficiency,     felt secondary to blood loss with colitis exacerbation, stable.  CONSULTATIONS:  None.  PROCEDURES:  Flexible sigmoidoscopy was done on the day of admission per Dr. Leone Payor.  HISTORY:  Jhayla Podgorski is a 45 year old female known to Dr. Leone Payor with history of ulcerative colitis.  She had not been seen in our office since January 2010, at which time she was being maintained on azathioprine and doing very well.  Apparently shortly after that visit, she stopped the azathioprine because she was feeling well, this was not discussed with Dr. Leone Payor.  The patient apparently has had a few what she describes as minor flare since that time, but had overall been doing well until about 2 weeks ago after she returned from vacation to [place]. She developed an acute illness which was felt to be traveler's diarrhea as did other people she traveled with.  She was seen by Dr. Laury Axon, her primary care physician, who treated her with a week's worth of Cipro which apparently did help somewhat, however, at this point, her diarrhea has not resolved, has just  become smaller in volume, and she has developed bright red blood with each bowel movement as well as nausea and vomiting.  She has had a significant weight loss over the past few weeks as well.  At this point, she is so nauseated that she is having a difficult time holding down fluids and has not been eating.  She denies any fever.  She was seen and evaluated in the office on March 28, 2010, per Willette Cluster, NP, at that time was set up for flexible sigmoidoscopy with Dr. Leone Payor.  Sigmoidoscopy on the day of admission shows moderate-to-severely active ulcerative colitis from the rectum to the sigmoid.  She was admitted at this time for IV fluid rehydration, antiemetics, and IV steroids for acute exacerbation of her colitis.  LABORATORY STUDIES:  On admission March 29, 2010, WBC of 6.6, hemoglobin 11.2, hematocrit of 33.7, MCV of 84, platelets 333.  Followup on April 01, 2010, after hydration, WBC of 5.7, hemoglobin 7.8, hematocrit of 24.4, MCV of 87.  On admission, potassium was 2.5.  Liver function studies normal.  Albumin of 1.9.  Followup on April 01, 2010, potassium is 4.5.  Serum iron 46, TIBC of 193, saturation of 24, B12 of 1146  and folate 10.6.  Hepatitis B surface antibody negative and hepatitis A antibody negative.  Clostridium difficile by PCR was negative.  HOSPITAL COURSE:  The patient was admitted to the Service of Dr. Stan Head.  She was placed on IV fluid rehydration.  Potassium was repleted first intravenously and then p.o.  She was started on IV Solu- Medrol at 60 mg/24 hours.  She improved over the first 24 hours and had no further vomiting.  She remained nauseated and then this resolved as well.  We were able to gradually advance her diet.  She continued to have frequent stools so generally with 4-6 bowel movements per day.  She was seeing some blood with her stools though amount decreasing.  By April 01, 2010, she was able to eat solid food.  We converted her  Solu- Medrol to prednisone.  We added Bentyl to her regimen for urgency and cramping and this seemed to help as well.  Stool for Clostridium difficile was checked by PCR and was negative.  She did have a significant drop in her hemoglobin from admission and it was felt this is likely due to hydration.  She had a hemoglobin of 7.8.  Iron studies, B12 and folate all within normal limits.  Also note that she has a low albumin at 1.9, most suggesting that her colitis has been active.  The patient was discharged home on April 02, 2010, with instructions to follow up with Dr. Leone Payor in the office on April 16, 2010, at 2:45 p.m. to call for any problems in the interim.  She will follow a low-residue diet for another week or so and then gradually advance.  MEDICATIONS ON DISCHARGE: 1. Prednisone 60 mg q.a.m. x1 week and then 40 mg q.a.m. thereafter. 2. Bentyl 10 mg t.i.d. p.r.n. 3. Multivitamin daily as previous. 4. Caltrate plus D daily as previous.  CONDITION ON DISCHARGE:  Stable and improved.     Amy Esterwood, PA-C   ______________________________ Barbette Hair Arlyce Dice, MD,FACG    AE/MEDQ  D:  04/02/2010  T:  04/02/2010  Job:  [id number]  Electronically Signed by AMY ESTERWOOD PA-C on 04/05/2010 04:19:37 PM Electronically Signed by Melvia Heaps MDFACG on 04/15/2010 12:24:37 PM

## 2010-04-16 ENCOUNTER — Encounter: Payer: Self-pay | Admitting: Internal Medicine

## 2010-04-16 ENCOUNTER — Other Ambulatory Visit (INDEPENDENT_AMBULATORY_CARE_PROVIDER_SITE_OTHER): Payer: BC Managed Care – PPO

## 2010-04-16 ENCOUNTER — Ambulatory Visit (INDEPENDENT_AMBULATORY_CARE_PROVIDER_SITE_OTHER): Payer: BC Managed Care – PPO | Admitting: Internal Medicine

## 2010-04-16 VITALS — BP 108/64 | HR 68 | Ht 66.0 in | Wt 125.0 lb

## 2010-04-16 DIAGNOSIS — M25562 Pain in left knee: Secondary | ICD-10-CM | POA: Insufficient documentation

## 2010-04-16 DIAGNOSIS — D500 Iron deficiency anemia secondary to blood loss (chronic): Secondary | ICD-10-CM

## 2010-04-16 DIAGNOSIS — E538 Deficiency of other specified B group vitamins: Secondary | ICD-10-CM

## 2010-04-16 DIAGNOSIS — Z9119 Patient's noncompliance with other medical treatment and regimen: Secondary | ICD-10-CM

## 2010-04-16 DIAGNOSIS — K5190 Ulcerative colitis, unspecified, without complications: Secondary | ICD-10-CM

## 2010-04-16 DIAGNOSIS — E876 Hypokalemia: Secondary | ICD-10-CM

## 2010-04-16 DIAGNOSIS — Z7289 Other problems related to lifestyle: Secondary | ICD-10-CM

## 2010-04-16 DIAGNOSIS — M25561 Pain in right knee: Secondary | ICD-10-CM

## 2010-04-16 DIAGNOSIS — K5100 Ulcerative (chronic) pancolitis without complications: Secondary | ICD-10-CM

## 2010-04-16 DIAGNOSIS — Z79899 Other long term (current) drug therapy: Secondary | ICD-10-CM

## 2010-04-16 DIAGNOSIS — M25569 Pain in unspecified knee: Secondary | ICD-10-CM

## 2010-04-16 LAB — COMPREHENSIVE METABOLIC PANEL
ALT: 26 U/L (ref 0–35)
AST: 20 U/L (ref 0–37)
Albumin: 3.2 g/dL — ABNORMAL LOW (ref 3.5–5.2)
Alkaline Phosphatase: 50 U/L (ref 39–117)
Potassium: 3.8 mEq/L (ref 3.5–5.1)
Sodium: 139 mEq/L (ref 135–145)
Total Bilirubin: 0.4 mg/dL (ref 0.3–1.2)
Total Protein: 6 g/dL (ref 6.0–8.3)

## 2010-04-16 LAB — CBC WITH DIFFERENTIAL/PLATELET
Eosinophils Absolute: 0 10*3/uL (ref 0.0–0.7)
Eosinophils Relative: 0.1 % (ref 0.0–5.0)
HCT: 30.9 % — ABNORMAL LOW (ref 36.0–46.0)
Lymphs Abs: 0.6 10*3/uL — ABNORMAL LOW (ref 0.7–4.0)
MCHC: 33.7 g/dL (ref 30.0–36.0)
MCV: 83.2 fl (ref 78.0–100.0)
Monocytes Absolute: 0 10*3/uL — ABNORMAL LOW (ref 0.1–1.0)
Platelets: 237 10*3/uL (ref 150.0–400.0)
RDW: 14.4 % (ref 11.5–14.6)
WBC: 3.5 10*3/uL — ABNORMAL LOW (ref 4.5–10.5)

## 2010-04-16 MED ORDER — PREDNISONE 20 MG PO TABS: 20.0000 mg | ORAL_TABLET | Freq: Every day | ORAL | Status: DC

## 2010-04-16 MED ORDER — AZATHIOPRINE 50 MG PO TABS: 50.0000 mg | ORAL_TABLET | Freq: Every day | ORAL | Status: DC

## 2010-04-16 NOTE — Assessment & Plan Note (Signed)
B12 > 1000 at recent 03/2010 hospitalization

## 2010-04-16 NOTE — Progress Notes (Signed)
HPI: This is a 45 yo ww with ulcerative colitis here for follow-up after recent hospitalization and flare. She had stopped her therapy over 2 years ago. Went to [place] and developed diarrhea, dehydration and severe hypokalemia. Now with mostly morning bowel movements, more formed mushy. No bleeding. Energy level is better, 50-60% of normal. No nausea or vomiting. No fever. Left > right knee pain, onset 3 nights ago, awakened from sleep. Using hydrocodone.  Abdominal pain right > left, tenderness.   Night sweats, feels hot. Eating much better. Weight up a few pounds. She would like to go back to work some.   Past Medical History  Diagnosis Date  . Ulcerative colitis     Anemia  . Vitamin B12 deficiency   . Osteoporosis   . Cellulitis of right groin   . Spontaneous pneumothorax 08/2002   Past Surgical History  Procedure Date  . Wisdom tooth extraction   . 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 Sigmoidoscopy 03/29/10    mod-severe UC, also 2008 - severe UC, 2007 - UC  . Colonoscopy 2005    microscopic colitis, normal terminal ileum    reports that she has quit smoking. She does not have any smokeless tobacco history on file. She reports that she drinks about 2.4 ounces of alcohol per week. She reports that she does not use illicit drugs. family history is not on file.  She is adopted. Allergies  Allergen Reactions  . Aspirin   . Penicillins        Physical Exam: Vital signs from this visit reviewed Constitutional: generally well-appearing Lungs: clear Heart: S1 S2 Abdomen: soft, mildly tender RLQ without masses or rebound BS+ Ext: no edema left Knee is with ? Small effusion and tender on medial aspect with FROM, no rash or warmth Right knee minimally tender medial aspect  Assessment and plan:

## 2010-04-16 NOTE — Assessment & Plan Note (Addendum)
Improved since hospitalization with less diarrhea. Is having some abdominal pan and mildly tender in right abdomen. Currently on prednisone 40 mg daily and will taper to 30, 20 and then 15 mg a day reducing every 2 weeks. REV in about 4 weeks. Will start back on azathioprine today at 75 mg daily. Back to work for 4, 6 and 8 hours increasing by 2 hours each week as tolerated

## 2010-04-16 NOTE — Assessment & Plan Note (Signed)
Will reassess with labs

## 2010-04-16 NOTE — Patient Instructions (Signed)
Please contact Dr Laury Axon regarding your knee pain. You will go to the basement for labs today  Please schedule a follow up appointment for 4-6 weeks at the front desk on your way out today. Follow the instructions on your Return to work form.

## 2010-04-16 NOTE — Assessment & Plan Note (Signed)
New issue for 3 days Left worse than right No significant inflammation or deformity on exam with exception of possible effusion left knee 9small I have asked her to see Dr. Laury Axon about this. Would avoid NSAID's given UC.

## 2010-04-16 NOTE — Assessment & Plan Note (Signed)
She had been drinking regularly and ? Excessively. Says minimal consumption now. Will need to follow-up with her again on this as could be problem.

## 2010-04-16 NOTE — Assessment & Plan Note (Signed)
Recurrent issue due to UC flare in 2012. B12 level was ok.

## 2010-04-16 NOTE — Assessment & Plan Note (Signed)
Restarting azathioprine 75 mg/day. TPMT phenotype is normal. Advised to have GYN follow-up an PAP this year. She was naive to Hep B and Hep a with labs at hospital so will need vaccination. She had pneumovax 04/01/10. Influenza vaccine taken in 2011.

## 2010-04-17 ENCOUNTER — Telehealth: Payer: Self-pay | Admitting: *Deleted

## 2010-04-17 DIAGNOSIS — K5190 Ulcerative colitis, unspecified, without complications: Secondary | ICD-10-CM

## 2010-04-17 NOTE — Telephone Encounter (Signed)
Notified pt of her lab results and that Dr Leone Payor thinks the abnormal values are coming from the Prednisone and the tapering should stabilize then. She needs to repeat the CBC and BMET on 04/24/10; pt stated understanding.

## 2010-04-17 NOTE — Telephone Encounter (Signed)
Lmom for pt to call back. 

## 2010-04-24 ENCOUNTER — Other Ambulatory Visit: Payer: Self-pay | Admitting: *Deleted

## 2010-04-24 ENCOUNTER — Other Ambulatory Visit (INDEPENDENT_AMBULATORY_CARE_PROVIDER_SITE_OTHER): Payer: BC Managed Care – PPO

## 2010-04-24 ENCOUNTER — Encounter (HOSPITAL_COMMUNITY): Payer: BC Managed Care – PPO | Admitting: Psychiatry

## 2010-04-24 DIAGNOSIS — K5190 Ulcerative colitis, unspecified, without complications: Secondary | ICD-10-CM

## 2010-04-24 LAB — BASIC METABOLIC PANEL
BUN: 12 mg/dL (ref 6–23)
CO2: 29 mEq/L (ref 19–32)
Calcium: 8.7 mg/dL (ref 8.4–10.5)
Chloride: 102 mEq/L (ref 96–112)
Creatinine, Ser: 0.8 mg/dL (ref 0.4–1.2)
Glucose, Bld: 160 mg/dL — ABNORMAL HIGH (ref 70–99)
Potassium: 4.2 mEq/L (ref 3.5–5.1)
Sodium: 136 mEq/L (ref 135–145)

## 2010-04-24 LAB — CBC WITH DIFFERENTIAL/PLATELET
Eosinophils Absolute: 0 10*3/uL (ref 0.0–0.7)
Eosinophils Relative: 0 % (ref 0.0–5.0)
Lymphocytes Relative: 9.8 % — ABNORMAL LOW (ref 12.0–46.0)
MCHC: 32.9 g/dL (ref 30.0–36.0)
MCV: 81.8 fl (ref 78.0–100.0)
Monocytes Absolute: 0 10*3/uL — ABNORMAL LOW (ref 0.1–1.0)
Neutrophils Relative %: 89.6 % — ABNORMAL HIGH (ref 43.0–77.0)
Platelets: 286 10*3/uL (ref 150.0–400.0)
WBC: 7 10*3/uL (ref 4.5–10.5)

## 2010-04-25 NOTE — Progress Notes (Signed)
Quick Note:  Labs ok overall Still anemic and her glucose is still high - likely from prednisone. She needs to repeat CBC and do a CMET on April 16 please Dx v58.69 long-term use medications Ask her if she is getting better and does she have any ?'s ______

## 2010-04-26 ENCOUNTER — Telehealth: Payer: Self-pay | Admitting: *Deleted

## 2010-04-26 DIAGNOSIS — Z86718 Personal history of other venous thrombosis and embolism: Secondary | ICD-10-CM

## 2010-04-26 DIAGNOSIS — Z7901 Long term (current) use of anticoagulants: Secondary | ICD-10-CM

## 2010-04-26 DIAGNOSIS — Z79899 Other long term (current) drug therapy: Secondary | ICD-10-CM

## 2010-04-26 NOTE — Telephone Encounter (Signed)
Message copied by Graciella Freer on Fri Apr 26, 2010  5:16 PM ------      Message from: Stan Head      Created: Thu Apr 25, 2010  5:55 PM       Labs ok overall      Still anemic and her glucose is still high - likely from prednisone.      She needs to repeat CBC and do a CMET on April 16 please      Dx v58.69 long-term use medications      Ask her if she is getting better and does she have any ?'s

## 2010-04-26 NOTE — Telephone Encounter (Signed)
N

## 2010-04-30 ENCOUNTER — Ambulatory Visit: Payer: BC Managed Care – PPO | Admitting: Internal Medicine

## 2010-06-03 ENCOUNTER — Ambulatory Visit (INDEPENDENT_AMBULATORY_CARE_PROVIDER_SITE_OTHER): Payer: BC Managed Care – PPO | Admitting: Internal Medicine

## 2010-06-03 ENCOUNTER — Encounter: Payer: Self-pay | Admitting: Internal Medicine

## 2010-06-03 VITALS — BP 108/62 | HR 74 | Ht 66.75 in | Wt 136.6 lb

## 2010-06-03 DIAGNOSIS — Z7289 Other problems related to lifestyle: Secondary | ICD-10-CM

## 2010-06-03 DIAGNOSIS — M25561 Pain in right knee: Secondary | ICD-10-CM

## 2010-06-03 DIAGNOSIS — K5100 Ulcerative (chronic) pancolitis without complications: Secondary | ICD-10-CM

## 2010-06-03 DIAGNOSIS — E538 Deficiency of other specified B group vitamins: Secondary | ICD-10-CM

## 2010-06-03 DIAGNOSIS — D500 Iron deficiency anemia secondary to blood loss (chronic): Secondary | ICD-10-CM

## 2010-06-03 DIAGNOSIS — Z79899 Other long term (current) drug therapy: Secondary | ICD-10-CM

## 2010-06-03 DIAGNOSIS — D899 Disorder involving the immune mechanism, unspecified: Secondary | ICD-10-CM

## 2010-06-03 DIAGNOSIS — M25569 Pain in unspecified knee: Secondary | ICD-10-CM

## 2010-06-03 MED ORDER — PREDNISONE (PAK) 10 MG PO TABS: ORAL_TABLET | ORAL | Status: DC

## 2010-06-03 NOTE — Assessment & Plan Note (Signed)
This appears resolved.  

## 2010-06-03 NOTE — Assessment & Plan Note (Addendum)
Improving nicely as she tapers prednisone and is back on her azathioprine. She is at 75 mg daily. Synthroid 0.2 mg per kilogram daily. Will continue that dose. She needs a CBC and  metabolic panel to followup, she will return tomorrow if she needs to return to work today. We'll taper the prednisone over the next 3 weeks. Followup in approximately 3-4 months. Sooner if needed.

## 2010-06-03 NOTE — Assessment & Plan Note (Signed)
Need to followup on vaccinations etc. We didn't have time to do that today. No live vaccines. EKG routine lab followup to monitor for toxicity. She also needs Pap smears annually given her immunosuppression. Skin cancer precautions as well. We'll revisit these.

## 2010-06-03 NOTE — Progress Notes (Signed)
  Subjective:    Patient ID: [name] [name], female    DOB: [birth year], 45 y.o.   MRN: [id number]  HPI 45 year old married white woman with universal ulcerative colitis. Recently returned to my care after a spell of noncompliance of medication. She had fairly severe colitis and was admitted to the hospital and restarted on prednisone and then her azathioprine was restarted. She has been tapering prednisone and says her stools are soft and controlled this time. There is no bleeding. Her energy level is improved greatly. She was able to perform a 5K walk this weekend and that she was tired somewhat she did that without major difficulty. She is back to work full-time. She claims compliance with her medications and has been no evidence of toxicity from medication so far. She is down to prednisone 10 mg daily at this time.    Review of Systems The severe knee pain she had at last visit is gone and lasted only several days.    Objective:   Physical Exam Thin and in  no acute distress well-developed Clear Heart S1-S2 no rubs or gallops  abdomen is soft and nontender       Assessment & Plan:

## 2010-06-03 NOTE — Patient Instructions (Addendum)
You will need to come to tomorrow for labs Prednisone taper You will need to follow up in 3 months Cc. Myrene Buddy Lowne,MD

## 2010-06-03 NOTE — Assessment & Plan Note (Signed)
Not revisited today due to shortage of time. Will followup.

## 2010-06-03 NOTE — Assessment & Plan Note (Signed)
CBC today. Hopefully with treatment of the disease this is improved as well.

## 2010-06-03 NOTE — Assessment & Plan Note (Signed)
Not on chronic supplementation but levels have been okay

## 2010-06-04 ENCOUNTER — Other Ambulatory Visit (INDEPENDENT_AMBULATORY_CARE_PROVIDER_SITE_OTHER): Payer: BC Managed Care – PPO

## 2010-06-04 DIAGNOSIS — K5100 Ulcerative (chronic) pancolitis without complications: Secondary | ICD-10-CM

## 2010-06-04 DIAGNOSIS — Z79899 Other long term (current) drug therapy: Secondary | ICD-10-CM

## 2010-06-04 LAB — CBC WITH DIFFERENTIAL/PLATELET
Basophils Absolute: 0 10*3/uL (ref 0.0–0.1)
Eosinophils Absolute: 0 10*3/uL (ref 0.0–0.7)
Lymphocytes Relative: 32.8 % (ref 12.0–46.0)
MCHC: 32 g/dL (ref 30.0–36.0)
MCV: 75.2 fl — ABNORMAL LOW (ref 78.0–100.0)
Monocytes Absolute: 0.3 10*3/uL (ref 0.1–1.0)
Neutrophils Relative %: 60 % (ref 43.0–77.0)
Platelets: 218 10*3/uL (ref 150.0–400.0)
RBC: 3.8 Mil/uL — ABNORMAL LOW (ref 3.87–5.11)
RDW: 16.8 % — ABNORMAL HIGH (ref 11.5–14.6)

## 2010-06-04 LAB — COMPREHENSIVE METABOLIC PANEL
ALT: 20 U/L (ref 0–35)
AST: 27 U/L (ref 0–37)
Albumin: 3.8 g/dL (ref 3.5–5.2)
Alkaline Phosphatase: 37 U/L — ABNORMAL LOW (ref 39–117)
BUN: 10 mg/dL (ref 6–23)
Calcium: 9.1 mg/dL (ref 8.4–10.5)
Chloride: 102 mEq/L (ref 96–112)
Potassium: 4.2 mEq/L (ref 3.5–5.1)
Sodium: 137 mEq/L (ref 135–145)

## 2010-06-06 NOTE — Progress Notes (Signed)
Quick Note:  She remains anemic Needs to start ferrous sulfate 325 mg bid Cannot remember if she has intolerance - if she does we will need to consider IV iron, which is a quicker way to boost iron and Hgb  Repeat CBC in 1 month - if she is feeling more tired or unwell before then she should call back sooner ______

## 2010-06-07 ENCOUNTER — Other Ambulatory Visit: Payer: Self-pay

## 2010-06-07 DIAGNOSIS — D649 Anemia, unspecified: Secondary | ICD-10-CM

## 2010-06-11 NOTE — Assessment & Plan Note (Signed)
HEALTHCARE                         GASTROENTEROLOGY OFFICE NOTE   NAME:[name], [name] HESLER                      MRN:          [id number]  DATE:07/14/2006                            DOB:          [birth year]    CHIEF COMPLAINT:  Hair falling out, knot on the back of my neck,  aches.   [name] [name] had been doing very well on azathioprine for her ulcerative  colitis.  That is under good control.  Over the past several weeks since  tapering off prednisone, she has had problems where the hair on her head  is coming out in clumps.  Interestingly, her hair did not grow much in  her axillary area, genital area, or legs while she was on the  prednisone, but she thought it grew more on the top of her head.  She  also developed some sort of a knot on the back of her neck, and she has  been very achy if she sits for a while or exercises.  She thinks there  has been some ankle swelling but not much joint swelling.  She does  remember some arthralgias and problems prior to her initiating the  prednisone in her ulcerative colitis flare.  Energy level seems to be  okay.   No other new medications.  Medications are listed and reviewed in the  chart.   PROBLEMS/PAST MEDICAL HISTORY:  1. Severe ulcerative colitis, on immunomodulator therapy.  2. Anemia, improved.  3. Vitamin B12 deficiency.  4. Malnutrition secondary to ulcerative colitis problems.  This has      improved over time.  5. Internal hemorrhoids.  6. Right thigh cellulitis.  7. History of spontaneous pneumothorax.  8. Allergic rhinitis and sinusitis.   PHYSICAL EXAMINATION:  GENERAL:  A petite, thin white woman in no acute  distress.  VITAL SIGNS:  Weight 133 pounds.  Pulse 88, blood pressure 118/68.  HEENT:  Eyes anicteric.  NECK:  There is a red, raised lesion that is mildly tender.  It looks  like an infected sebaceous cyst or a hair follicle.  There is nothing  expressed.  There is no surrounding  lymphadenopathy.  EXTREMITIES:  Lower extremities are free of edema.  The feet and the  hands look normal without arthritic deformities.  There is no skin rash  in the hands or lower extremities.  SKIN:  Inspection of the scalp reveals some fine hair.  There is no bald  spot or patch.   ASSESSMENT:  1. Ulcerative colitis:  This is doing well.  2. Question reaction to azathioprine versus withdrawal of prednisone.      Certainly, the hair falling out is a known problem with the      azathioprine.  Joint problems, myalgias can be as well.  Maybe it      is the prednisone masking some underlying arthritic complaints      versus masking side effects from the azathioprine, and now that she      is off the prednisone, she is feeling this way, as she did not have      these problems before.  PLAN:  1. Check CBC, CMET, CPK, sed rate, C-reactive protein.  May need      metabolites, although will hold off on that.  2. We are going to hold her azathioprine to see how she does and call      to follow up.  3. Other treatment options could include Remicade versus trying 6-      mercaptopurine, although that is essentially the same drug.  4. She may need tertiary advice as far as other possible treatments,      although she is not interested in any research protocols.  5. If she gets sick again while she is off the azathioprine, she is to      reinitiate prednisone at 40 mg a day.  A prescription is given to      the patient.     [name] Boop, MD,FACG  Electronically Signed    CEG/MedQ  DD: 07/14/2006  DT: 07/14/2006  Job #: [id number]   cc:   Lelon Perla, DO

## 2010-06-11 NOTE — Assessment & Plan Note (Signed)
[facility]                         GASTROENTEROLOGY OFFICE NOTE   NAME:[name], [name] LENART                      MRN:          [id number]  DATE:10/05/2006                            DOB:          11/05/1965    ADDENDUM   Canasa was not on her formulary.  I gave her samples of six, and we will  try that instead of 14 days while we are increasing the azathioprine.     Iva Boop, MD,FACG  Electronically Signed    CEG/MedQ  DD: 10/05/2006  DT: 10/05/2006  Job #: [id number]

## 2010-06-11 NOTE — Assessment & Plan Note (Signed)
[facility]                         GASTROENTEROLOGY OFFICE NOTE   NAME:[name], [name] OSHEA                      MRN:          [id number]  DATE:10/05/2006                            DOB:          [birth year]    CHIEF COMPLAINT:  Followup of ulcerative colitis.   PAST MEDICAL HISTORY:  See previous Problem List dictated.   [name] [name] is having a little bit of rectal bleeding and crampy pain and  slightly looser stools a few times a week for a couple of weeks.  Otherwise, she has been doing well, her hair seems to have stopped  falling out; in fact, there is some more growing in.  That may have been  related to her severe illness and not so much related to the  azathioprine.  She has gained weight, she is up to 133 pounds.  She is  back to work full time and traveling.  She has no skin rash or fever  complaints or anything like that.  Weight really is stable from June.  Her white count is 3.6 with 2000 neutrophils, LFTs normal.   PHYSICAL:  Height 5 feet 6 inches, weight 133 pounds, pulse 76, blood  pressure 120/70.  ABDOMEN:  Soft with minimal left lower quadrant tenderness to deep  palpation without any organomegaly or mass.  SKIN:  Warm, dry, no acute rash.  She appears to have no significant  alopecia.  She is alert and oriented x3.   ASSESSMENT:  Universal ulcerative colitis on immunomodulator therapy.   PLAN:  She is doing reasonably well but it seems like things are flaring  a little bit.  There is a lot of room to go on her azathioprine dose, we  had cut that in the past.  I think in part because of her hair falling  out.  I am going to go up to 75 mg a day because of the white count,  where it is, though she had been on 100 before.  As long as her white  count is okay will try to ramp that up.  Will also add Canasa  suppository at bedtime for 2 weeks, longer if needed.  Will need to  prescribe that.  Samples given as well.  I will see her back in  2  months, we will have Lab followup with CBC and LFTs (transaminases) in 2  weeks.  Short term prednisone could be used if she flares again.   ADDENDUM:  [name] [name] was not on her formulary.  I gave her samples of six,  and we will try that instead of 14 days while we are increasing the  azathioprine.     [name] Boop, MD,FACG  Electronically Signed    CEG/MedQ  DD: 10/05/2006  DT: 10/05/2006  Job #: [id number]

## 2010-06-14 NOTE — Consult Note (Signed)
[name], [name]                         ACCOUNT NO.:  [id number]   MEDICAL RECORD NO.:  [id number]                   PATIENT TYPE:  INP   LOCATION:  0363                                 FACILITY:  [facility]   PHYSICIAN:  Mikey Bussing, M.D.           DATE OF BIRTH:  08/09/1965   DATE OF CONSULTATION:  08/29/2002  DATE OF DISCHARGE:                                   CONSULTATION   REASON FOR CONSULTATION:  Spontaneous right pneumothorax.   REFERRING PHYSICIAN:  Charlaine Dalton. Sherene Sires, M.D., primary care physician Wanda Plump, M.D., [facility].   CONSULTING PHYSICIAN:  Kerin Perna, M.D.   CHIEF COMPLAINT:  Chest pain.   HISTORY OF PRESENT ILLNESS:  I was asked to evaluate this 45 year old white  female for potential right VATS, blood resection, and pleurodesis for a  spontaneous right pneumothorax which has not resolved following tube  thoracostomy.  The patient developed pleuritic chest pain for several days  prior to the presentation for evaluation by Casimiro Needle B. Sherene Sires, M.D. This  evaluation included a chest x-ray which demonstrated 25% circumferential  right pneumothorax.  The patient denied any preceding trauma or specific  event that could have caused the pneumothorax.  She denies any prior history  of spontaneous pneumothorax.  She smokes one-half pack of cigarettes a day.  She is in the process of moving from one house to another.  Dr. Sherene Sires ordered  a CT scan which confirmed a circumferential right pneumothorax without  evidence of abnormal densities or lesions in the lung.  There were right  pleural blebs seen on the CT scan.  Dr. Sherene Sires admitted the patient for an  elective right chest tube placement which was performed three days ago.  The  chest tube placement improved the right pneumothorax but did not resolve it.  Serial daily chest x-rays did show a residual right apical pneumothorax of  10-15% and she had a persistent air leak through the chest tube.   The chest  tube appears to be in proper position and it was felt she would be a  potential candidate for right VATS and stapling of blebs to treat her  spontaneous pneumothorax.   PAST MEDICAL HISTORY:  1. Surgical history positive for wisdom teeth extraction and cryosurgery for     cervical dysplasia  2. Allergies to PENICILLIN and ASPIRIN.  3. No home medications other than p.r.n. Advil.  4. Positive smoking history of one-half pack per day.   SOCIAL HISTORY:  The patient is a Acupuncturist.  She has two small  children and lives with her husband.   FAMILY HISTORY:  Positive for emphysema in her father who is also a smoker.   REVIEW OF SYSTEMS:  She denies any systemic or constitutional symptoms of  weight loss, fever, or night sweats.  She denies any change in vision or  difficulty swallowing.  She denies any  active dental problems.  Pulmonary  review is negative for productive cough, abnormal chest x-ray, previous  pneumothorax, or chest trauma.  Cardiac review is negative for angina,  palpitation, arrhythmia, or history of cardiac murmur.  Abdominal review is  negative for change in bowel habits or abdominal pain.  Urologic review is negative for dysuria or hematuria.  Vascular review is  negative for DVT, claudication, or TIA.  Endocrine review is negative for  diabetes or thyroid disorder.  Hematologic review is negative for prior  blood transfusion or bleeding disorder.  Musculoskeletal review is negative  for significant fracture or injury.  Neurologic review is negative for  seizure or stroke.   PHYSICAL EXAMINATION:  VITAL SIGNS:  The patient is 5 feet 6 inches and  weighs 120 pounds.  Blood pressure is 100/60.  Pulse 72.  Temperature 97.5.  Room air saturation 98%.  GENERAL APPEARANCE:  A pleasant young white female in her hospital room with  a right chest tube attached to Pleur-evac suction drainage.  She is in no  distress and is accompanied by her husband.   HEENT:  Normocephalic.  Full EOMs.  Pharynx clear.  NECK:  No adenopathy, subcutaneous air, mass, or carotid bruit.  LYMPHATIC EXAM:  No supraclavicular or axillary adenopathy.  PULMONARY EXAM:  Breath sounds bilaterally.  There are a few scattered  rhonchi in the right lung.  She has a chest tube secured to the right chest  with adhesive tape.  The left lung is clear.  CARDIAC:  Regular rhythm without murmur or gallop.  ABDOMEN:  Soft, nontender.  EXTREMITIES:  No cyanosis, clubbing, or edema.  RECTAL:  Deferred.  VASCULAR:  Pulses 2+ in all extremities.  NEUROLOGICAL:  Intact.   LABORATORY DATA:  I reviewed her chest x-rays and CT scan and she has a  residual right apical pneumothorax.  The chest tube appears to be in proper  placement.   EXAM OF THE PLEUR-EVAC:  She has a persistent air leak during coughing.   IMPRESSION/RECOMMENDATION:  This patient has been treated with a tube  thoracostomy for a right spontaneous pneumothorax which has not completely  resolved nor has the air leak resolved.  She would be a candidate for right  video-assisted thorascopic surgery, stapling of bleb, and a pleurodesis.  I  have discussed the procedure in detail with the patient and her husband  including the location of the incisions, the use of some general anesthesia,  the use of a postoperative chest tube drainage system, and the alternatives  to surgical therapy.  She understands the procedure and its associated risks  of prolonged air leak, blood transfusion, and the possibility that an open  thoracotomy would be needed.  She will consider the procedure as discussed  and finalize her decision in the next few hours.   Thank you very much for this consultation.                                               Mikey Bussing, M.D.    PV/MEDQ  D:  08/29/2002  T:  08/29/2002  Job:  [id number]   cc:   CVTS office   [facility]

## 2010-06-14 NOTE — Assessment & Plan Note (Signed)
[facility]                         GASTROENTEROLOGY OFFICE NOTE   NAME:[name] [name], [name] [name]                      MRN:          [id number]  DATE:04/07/2006                            DOB:          1965/11/08    CHIEF COMPLAINT:  Followup of ulcerative colitis.   Overall, she is better.  Back to work part time.  Intermittently gets  bloated, today feels like she is constipated and that she is bloated and  did not move her bowels this morning.  In the morning, she has some  loose bowel movements.  She is having less than 5 bowel movements a day  and that is better.  She will see some streaks of red blood or blood  staining the toilet paper or water in the morning.  Clearly less than  before.  She has not had a fever.  Her weight is up 2 pounds.  Her  appetite is voracious.  She still has some spells of shakiness but her  moodiness is better on the lower dose of prednisone.  She started  Azathioprine after her TPNP enzyme activity came back as normal.  She is  on 100 mg a day which is about 2 mg/kg.  Her hemoglobin went from 9.1 to  8.5.  Her white count is okay.  Her albumin is up to 2.  We have not  rechecked her potassium yet.  She is asking for refills on her  Glycopyrrolate that uses once or twice a day.  There are no new  problems.  Her medications are listed and reviewed in the chart.  Past  medical history reviewed and unchanged.   PHYSICAL EXAMINATION:  Weight 114 pounds, pulse 100, blood pressure  110/58.  She does not look as pale to me.  EYES:  Anicteric.  ABDOMEN:  Slightly distended, mildly tender.  Bowel sounds are present.  It is soft.  There is no rebound.  NEURO:  She is alert and oriented x3.   ASSESSMENT:  1. Ulcerative colitis, severe, still symptomatic but improving slowly.  2. Now on immunomodulator therapy, so far okay.  3. Anemia related to No. 1, her hemoglobin dipped though overall it is      up from the 7 range where it was  in January.  4. Weight loss that seems to have stabilized.  5. Improving albumin.  6. Hypokalemia not yet rechecked.   PLAN:  1. CMET and CBC will be checked next week as per plan.  2. Continue current medications, will not reduce the dose of her      prednisone yet.  3. Consider eventual bone densitometry, especially if she is on      prednisone for 3 months.  4. May need a sigmoidoscopy if she is not turning the corner further      and especially if she continues to have bleeding.  However her      general trend is towards improvement.  5. I refilled her Glycopyrrolate today.  6. I will see her back in one month, sooner if needed.     Iva Boop, MD,FACG  Electronically Signed    CEG/MedQ  DD: 04/07/2006  DT: 04/07/2006  Job #: [id number]   cc:   Lelon Perla, DO

## 2010-06-14 NOTE — Assessment & Plan Note (Signed)
[facility]                         GASTROENTEROLOGY OFFICE NOTE   NAME:[name] [name], [name] [name]                      MRN:          [id number]  DATE:03/13/2006                            DOB:          10/29/1965    CHIEF COMPLAINT:  Followup of ulcerative colitis.   Ms. Stegner is feeling better in general. Her weight is down to 112 from  122 pounds on January 31. She is having a fair amount of bloating and  still has some dyspnea on exertion particularly at the top of the steps  and feels weak. There has been some moodiness. Her diarrhea is  significantly better. The diarrhea is really just in the morning. That  is significantly improved. She seems to be eating quite well and  gradually advancing her diet. She is not describing rectal bleeding. Her  past medical history is otherwise unchanged.   PHYSICAL EXAMINATION:  GENERAL:  She appears asthenic but not as much as  last visit. Her weight is 112 pounds, pulse 86, blood pressure 94/54.  LUNGS:  Clear anteriorly.  HEART:  S1, S2, no rubs, murmurs or gallops.  EXTREMITIES:  Lower extremities free of edema.  ABDOMEN:  Mild to moderately tender in a somewhat diffuse fashion. Bowel  sounds are present but not increased. It is very soft, there is no  rebound. The violaceous discoloration of the abdominal wall seen  previously is less prominent.  NEUROLOGIC:  She is alert and oriented x3.   ASSESSMENT:  1. Ulcerative colitis, severe, improved on prednisone.  2. Some side effects of the prednisone with irritability and mood      disturbance.  3. Weakness probably multifactorial. She has anemia.  4. Weight loss. I am confused by this. She is better overall but the      weight is dropping, this does not really make sense. Perhaps the      previous weight was an error. We weighed her twice today and got      the same weight.   PLAN:  1. Two-view abdominal film today because of the abdominal film. This      has  returned showing reduced gaseous distention of the colon      compared to when she was hospitalized.  2. CBC and BMET and TPMT phenotype will be checked today. Anticipate      starting azathioprine versus 6-MP when we get the phenotype back.      She understands the plan and I reviewed the possible side effects.      She has read the information. She and her husband understand the      possibility of pancreatitis or other idiosyncratic reaction and the      possibility of hematologic problems.  3. Regarding her anemia, we did recheck a CBC. Her hemoglobin is up      from 7.4 to 9.1. She is to continue her p.o. iron. Her other      medications are listed and reviewed in the chart.  4. Reduce prednisone to 30 mg daily. Her glucose is 158 on her BMET.  Her potassium is 3.9 and she was started on potassium      supplementation. I think we will discontinue that for the time      being.  5. I will see her back in 3 weeks. She is to get a scale and weigh      herself at home. I would      hold off on returning to work at this point. She may be able to go      back parttime at some point but I think she is still relatively      weak for that.     Iva Boop, MD,FACG  Electronically Signed    CEG/MedQ  DD: 03/13/2006  DT: 03/13/2006  Job #: [id number]   cc:   Lelon Perla, DO
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
## 2010-06-14 NOTE — Op Note (Signed)
[name] [name], [name] [name]                         ACCOUNT NO.:  [id number]   MEDICAL RECORD NO.:  [id number]                   PATIENT TYPE:  INP   LOCATION:  3310                                 FACILITY:  [facility]   PHYSICIAN:  Kerin Perna III, M.D.           DATE OF BIRTH:  [birth year]   DATE OF PROCEDURE:  08/30/2002  DATE OF DISCHARGE:                                 OPERATIVE REPORT   PREOPERATIVE DIAGNOSIS:  Spontaneous right pneumothorax.   POSTOPERATIVE DIAGNOSIS:  Spontaneous right pneumothorax.   OPERATION PERFORMED:  Right video assisted thoracoscopic surgery, stapling  of apical blebs, mechanical pleurodesis.   SURGEON:  Kerin Perna, M.D.   ASSISTANT:  Rowe Clack, P.A.-C.   ANESTHESIA:  General by Dr. Jean Rosenthal.   INDICATIONS FOR PROCEDURE:  The patient is a 45 year old white female smoker  who presented to the pulmonary service with complaints of right pleuritic  chest pain.  The chest x-ray performed indicated a 25% spontaneous  pneumothorax and a chest tube was placed by the pulmonary service.  For the  next four days the pneumothorax improved but never resolved and a persistent  air leak was noted to the Pleur-evac water seal drain system.  Because of  the persistent pneumothorax and air leak despite adequate placement of the  chest tube, she was felt to be a candidate for surgical VATS procedure.   Prior to surgery I examined the patient in her hospital room and reviewed  the results of her x-rays and preoperative studies with the patient and her  husband.  I discussed the indications and expected benefits that a right  VATS procedure would provide her and the alternatives to surgical therapy  that were available as well.  I discussed major aspects of the proposed  procedure including use of general anesthesia, the location of the surgical  incisions, the fact that postoperative chest tube would be necessary, and  the expected postoperative recovery  period.  I reviewed with her the risks  and negative aspects of having this operation including incisional pain, the  possibility of wound infection, pneumonia, persistent air leak, bleeding and  blood transfusion requirement.  She understood these indications for the  surgery and agreed to proceed with the operation as planned under what I  felt was an informed consent.   DESCRIPTION OF PROCEDURE:  The patient was brought to the operating room and  placed supine on the operating table where general anesthesia was induced.  A double lumen endotracheal tube was placed by the anesthesiologist and  position confirmed.  She was turned to expose the right side which was  prepped and draped after removal of the previously placed chest tube.  Using  the original chest tube incision, two additional portals incisions were made  for the VATS procedure in the midaxillary line posterior to the scapula.  The VATS camera was inserted and the pleural space was examined.  The lung  was adequately collapsed.  There were no pleural adhesions.  There were no  visible pulmonary masses after examining the right upper, right middle and  right lower lobes.  There were apical blebs seen on the anterior aspect of  the upper lobe.  Using the instrumentation, these were isolated and  positioned and three applications of the endoscopic GIA surgical stapler  were applied to remove the apex containing the blebs.  This specimen was  submitted to pathology.  The staple line was then inspected with a camera  and found to be hemostatic and without evidence of air leak.  The apical  aspect of the lower lobe was also examined and no significant bleb formation  was seen.  Next, a pleurodesis was performed using mechanical abrasion of  the parietal pleura along the apex as well as anteriorly and posteriorly.  The pleurodesis produced a uniform moderate to severe pleural inflammation.   Next, the chest tube was inserted  anterior and below the previous chest tube  site.  A 28 French straight tube was positioned in the apex and secured.  Next, the lung was re-expanded and under direct vision through the  endoscopic camera there was good re-expansion of all lobes.  The camera was  then removed.   The incisions were then closed in layers using Vicryl sutures.  Sterile  dressings were applied.  Prior to reversal of anesthesia, the chest tube  drainage was noted to be minimal and there was no signficant air leak.  Sponge, needle and instrument counts were correct as documented by the  nursing staff.                                                Mikey Bussing, M.D.    PV/MEDQ  D:  08/30/2002  T:  08/31/2002  Job:  [id number]   cc:   Charlaine Dalton. Sherene Sires, M.D. [facility]   CVTS office

## 2010-06-14 NOTE — Assessment & Plan Note (Signed)
 HEALTHCARE                         GASTROENTEROLOGY OFFICE NOTE   NAME:[name] [name], [name] [name]                      MRN:          [id number]  DATE:01/05/2006                            DOB:          06/06/1965    CHIEF COMPLAINT:  Ulcerative colitis.   I confirmed ulcerative colitis at her sigmoidoscopy on November 28, 2005.  She went back on her Asacol and she is doing well with only scant  bleeding recently.  She is taking 6 Asacol tablets a day in b.i.d.  dosing.  She previously had an anemia.  Her weight is stable to slightly  increased.   Weight 131 pounds.  Pulse 64, blood pressure 90/60.   Other medications listed and reviewed in the chart.   ASSESSMENT:  Ulcerative colitis, though I am not exactly certain that  that is it, that is, it could be indeterminate colitis versus Crohn's,  favor the former 2.   PLAN:  1. Continue current medications.  2. Follow up anemia with a check of her CBC.  She did have a      hemoglobin in September.  3. Continue the Asacol, as mentioned, and see me in 6 months.  4. Full colonoscopy in 8-10 years.     Iva Boop, MD,FACG  Electronically Signed    CEG/MedQ  DD: 01/05/2006  DT: 01/06/2006  Job #: [id number]   cc:   Lelon Perla, DO

## 2010-06-14 NOTE — Assessment & Plan Note (Signed)
[facility]                         GASTROENTEROLOGY OFFICE NOTE   NAME:[name], [name]                      MRN:          [id number]  DATE:02/26/2006                            DOB:          [birth year]    CHIEF COMPLAINT:  Follow-up of ulcerative colitis flare with associated  anemia and malnutrition.   Ms. [name] [name] was hospitalized for several days. She presented with feeling  poorly and she did not really describe much diarrhea but then she ended  up having that. She was found to have severe ulcerative colitis despite  ASA therapy. Flexible sigmoidoscopy was performed. There was no  infection found. She was started on steroids and had a prompt  significant improvement but still remained ill. [name] [name] was used for  cramps. She had a profound anemia but wanted to try to avoid  transfusion; her hemoglobin fell as low as 7.  She is now home. She is  weak, she is trying to walk, she is a little sore from walking  yesterday. She uses a heating pad for cramps. She says that she is  having mushy stools at this time. She is having overnight stools as  well. She denies fever. The [name] [name] does help her cramps as well. She  has developed a change in the color of the skin on her abdomen, she  wonders what that is related to.   CURRENT MEDICATIONS:  1. Prednisone 60 mg daily.  2. Cephalexin 250 mg x4 daily (she developed a right thigh      cellulitis).  3. Hydrocodone/APAP 5 mg q.6 hours p.r.n.  4. Zegerid 40 mg daily.  5. Glycopyrrolate 2 mg b.i.d. p.r.n.  6. Repliva 21/7 daily.   ALLERGIES:  ASPIRIN.   PAST MEDICAL HISTORY:  1. Ulcerative colitis initially diagnosed with normal appearing      colonic mucosa in 2005.  2. Malnutrition secondary to #1.  3. Internal hemorrhoids.  4. Right thigh cellulitis, resolving.  5. Spontaneous pneumothorax in the past.  6. Allergic rhinitis and sinusitis.   REVIEW OF SYSTEMS:  She has some aches and pains. She  feels it in her  hip. Her Mom has been with her and has been making her walk and keep  moving. She is napping, she is still quite fatigued. She does not feel  ready to return to work.   PHYSICAL EXAMINATION:  GENERAL: She is an eusthenic pale white woman  looking mildly ill.  VITAL SIGNS: Weight 122 pounds. Height 5 feet, 6. Blood pressure 100/70.  ABDOMEN: Has an interwoven violaceous discoloration in the wall. She  still has mild to moderate lower quadrant tenderness in bilateral lower  quadrants.  EXTREMITIES: She has 1+ peripheral edema in the lower extremities.  HEENT: The eyes show pale conjunctivae, there is no injection or iritis.  RECTAL: Inspection of the rectal area shows a small hemorrhoid.  RIGHT THIGH: Shows resolution of the cellulitis, there is no rash, no  problems there. There is no groin adenopathy associated with that.   ASSESSMENT:  1. Severe ulcerative colitis.  2. Anemia due to chronic blood loss.  3. Malnutrition.  4. Right thigh cellulitis, improving.  5. Some hemorrhoid trouble (she has noted protrusion when she has      diarrhea at times).   PLAN:  1. I am going to cut her prednisone to 40 mg a day at this point.  2. She and her Mom, mainly her mom, asked large questions about diet      with vegetables, etc. I explained how diet cannot usually make      ulcerative colitis worse but she has been on a low residue diet and      I would try to stick to that and not totally exclude vegetables as      it would lessen the diarrhea.  3. CBC and CMET will be checked today. She is going to need a TPMT      phenotype but she wants to check with her insurance about the cost.  4. She is going to need to go on immunomodulator therapy. The      rationale and risks and benefits were explained and a hand-out      given today. I specifically discussed the need to monitor labs      because of the possibility of leukopenia or anemia or reactions      causing hepatitis or  pancreatitis. Also the possible risk of      leukemia or lymphoma type problems as well as the immune      suppression and increased risk of infections. I also explained that      something like Remicade was possible but at this point I did not      think she needed that though should she fail to respond further,      that would certainly provide a quicker response than an      immunomodulator and could be necessary.  5. I will see her back in 2 weeks.  6. Samples of Analpram for her hemorrhoids are given.   NOTE: She may need more aggressive iron therapy such as a iron infusion  versus transfusion depending upon the results of her CBC.   I filled out her FMLA paperwork. She is not ready to go back to work, at  the earliest I think maybe in a week part-time.     [name] Boop, MD,FACG  Electronically Signed    CEG/MedQ  DD: 02/26/2006  DT: 02/26/2006  Job #: [id number]   cc:   Loreen Freud, M.D.

## 2010-06-14 NOTE — Discharge Summary (Signed)
[name] [name]               ACCOUNT NO.:  [id number]   MEDICAL RECORD NO.:  [id number]          PATIENT TYPE:  INP   LOCATION:  1612                         FACILITY:  [facility]   PHYSICIAN:  Hedwig Morton. Juanda Chance, MD     DATE OF BIRTH:  1965-08-24   DATE OF ADMISSION:  02/10/2006  DATE OF DISCHARGE:  02/19/2006                               DISCHARGE SUMMARY   ADMITTING DIAGNOSES:  33. A 45 year old female with 2-week history of  acute illness      characterized bu  nausea, anorexia, malaise, questionable fevers,      weight loss and now with 4-day history of severe diarrhea and      intermittent hematochezia, suspect viral syndrome, rule out      pneumonia, rule out adverse drug reaction to Asacol.  2. Ulcerative colitis with questionable superimposed infectious      colitis.  3. Hypokalemia secondary to above.  4. Dehydration secondary to above.  5. History of spontaneous pneumothorax, 2004.   DISCHARGE DIAGNOSES:  1. Acute exacerbation of ulcerative colitis, improved.  2. Anemia, severe but stable.  Severe but stable status post IV iron      infusion.  3. Right lower extremity cellulitis, improved, etiology not clear  4. E. coli urinary tract infection, treated.   CONSULTATIONS:  None.   PROCEDURES:  1. Flexible sigmoidoscopy with Dr. Leone [name] and biopsies February 11, 2006.  2. Lower extremity Dopplers.   BRIEF HISTORY:  [name] [name] is a pleasant 45 year old white female known to  Dr. Leone [name] who was diagnosed with colitis in 2005 on biopsies but had  fairly normal-looking colon at that time.  She was seen this fall with  anemia and complaints of intermittent rectal bleeding, some abdominal  cramping and had undergone a repeat colonoscopy November 28, 2005, which  showed a left-sided colitis which appeared to be fairly confluent.  Biopsies were consistent with IBD, showing chronic active inflammation  and cryptitis.  She had been placed on Asacol at 6 tablets per day and  was doing fairly well with minimal symptoms.  Now, over the past 2  weeks, she has had onset of nausea, anorexia, malaise, and a subsequent  weight loss of 15 pounds.  She has not had any documented fever at home,  denied any dysphagia or odynophagia.  No myalgias or arthralgias, rash,  headache, etc.  She said she just had had no energy and no appetite.  She has been having diarrhea now since Friday, January 11, with up to 10-  12 bowel movements per day, some associated abdominal discomfort and  cramping.  She has also had some intermittent bloody stools.  She came  to the emergency room on January 11, was hydrated and discharged home,  had a potassium of 3.1.  Today, she called back stating she was feeling  worse, was advised to come to the ER, seen and evaluated and admitted at  this time for hydration and further diagnostic evaluation.   Laboratory studies on January 15:  WBC was 7.1, hemoglobin 9.3,  hematocrit of 28.2, platelets  480.  Serial values have been obtained.  On the 18th, hemoglobin of 7.5 hematocrit of 22.4.  On the 21st, WBC of  7.5, hemoglobin 8.6, hematocrit of 25.7, platelets 530 and on January  24, hemoglobin of 7.1, hematocrit of 20.9.  Sed rate was 16 on  admission.  Followup on the 22nd was 35.  Electrolytes on admission,  sodium 130, potassium 3.1, glucose 107, BUN 3, creatinine 0.4.  Albumin  on admission was 1.4.  LFTs within normal limits.  Lipase less than 10.  Hemoglobin A1c was 5.8, serum iron less than 10, B12 1299, and folate  8.2, ferritin of 56.  Beta HCG was negative.  UA on admission showed  positive for ketones, 3-6 WBCs 0-2 RBCs.  Urine culture 10 to 5th E.  Coli.  Stool cultures were all negative including stool for C.  difficile.   Lower extremity Dopplers were negative.  Abdominal films on admission  showed findings suggestive of colitis.   HOSPITAL COURSE:  The patient was admitted to the service of Dr. Stan Head.  She was initially  placed on a liquid diet.  Stool cultures  were obtained.  She underwent flexible sigmoidoscopy that afternoon and  was found to have a severe colitis with deep ulcerations felt consistent  with severe IBD, possible C. difficile as well.  Biopsies were taken,  and she was placed on IV Solu-Medrol and IV Flagyl.  She had gradual  improvement in her symptoms thereafter.  It was felt that she would need  an IBD panel, but this was to be done as an outpatient.  Her colon  biopsies showed chronic active colitis with ulcerations.  She also had a  significant anemia.  She was reluctant to have blood transfusion, and we  held off on that.  She tolerated it well.  She was found to be severely  iron deficient with an iron of less than 10, and she did receive an IV  iron infusion during her admission.  By the 21soft tissue, she was  eating, having minimal diarrhea, and had no further bleeding.  By the  22nd, she was feeling well from a GI standpoint, but had developed an  irritation in her right inner thigh which progressed to a significant  cellulitis with edema of the right medial and anterior side which was  exquisitely painful.  We started her on Rocephin, obtained Dopplers of  her right lower extremity which were negative and considered CT;  however, within 24 hours, this had significantly improved.  In the  interim, she was converted to oral steroids and by January 24, her  cellulitis had all but resolved.  Her hemoglobin was stable at 7.1.  She  was feeling well and was allowed discharge to home with instructions to  follow with Dr. Leone [name] on [place], to call for any problems in the  interim.  She was to stay on prednisone at 60 mg p.o. daily, Keflex 250  q.i.d. x7 days, Robinul Forte b.i.d. p.r.n. Vicodin 5/500 q.6 h p.r.n.  and Repliva 1 p.o. daily.  She was to remain off of Asacol.      Amy [place], PA-C      Dora M. Juanda Chance, MD  Electronically Signed   AE/MEDQ  D:   03/10/2006  T:  03/10/2006  Job:  [id number]

## 2010-06-14 NOTE — Assessment & Plan Note (Signed)
[facility]                                 ON-CALL NOTE   NAME:BLOCHImya, Mance                        MRN:          [id number]  DATE:02/06/2006                            DOB:          04-19-1965    PRIMARY CARE PHYSICIAN:  Dr. Laury Axon.   TELEPHONE CONVERSATION:  Ms. Rodier called in today after receiving a  phone call from Dr. Laury Axon, stating that she should call the doctor on  call. Ms. Lamos was found to have a potassium of 2.9 this afternoon and  Dr. Laury Axon attempted several times to contact her this evening but was  unsuccessful but did leave a message for her to call me. Ms. Hada did  call in and I advised her of the finding and potential risks of  hypokalemia. Patient was advised to seek medical care to have potassium  rechecked. She does report that she lives in [place], and will look  for the nearest emergency department but will stop at the Urgent Care,  to see if she can have her potassium check there. The patient expressed  understanding and agrees to be assessed.     Leanne Chang, M.D.  Electronically Signed    LA/MedQ  DD: 02/06/2006  DT: 02/07/2006  Job #: [id number]

## 2010-06-14 NOTE — Assessment & Plan Note (Signed)
[facility]                         GASTROENTEROLOGY OFFICE NOTE   NAME:[name], [name] STUCKERT                      MRN:          [id number]  DATE:05/08/2006                            DOB:          [birth year]    CHIEF COMPLAINT:  Followup of ulcerative colitis and anemia.   HISTORY:  She is feeling much better. Her weight is up from 114 to 122  pounds. Eating much more. Diarrhea is much less, abdominal cramps almost  gone really. Her hemoglobin was 8.4 on March 18, it is now up to 10.3. I  did discover a low vitamin B12 level with these labs of April 8 and the  level is 180. Her albumin is up from 2.6 to 2.7.  Her LFTs,  electrolytes, blood sugar are all fine. She has been on azathioprine 100  mg daily since about February 24 or March 23, 2006.   MEDICATIONS:  Medications are listed and reviewed in the chart.   PHYSICAL EXAMINATION:  VITAL SIGNS:  Weight 122 pounds, blood pressure  is 90/60, pulse 80, slightly cushingoid faces are noted.  ABDOMEN:  Soft and nontender for the first time today.  SKIN:  Warm and dry, no acute rash.  GENERAL:  She is alert and oriented x3.   ASSESSMENT:  1. Severe ulcerative colitis on immunomodulator therapy much improved.  2. Anemia, improved.  3. New diagnosis of vitamin B12 deficiency.   PLAN:  1. Continue current medications.  2. Taper prednisone from 15 to 10 mg daily which she will do for 2      weeks and then 5 mg daily for 2 weeks and 5 mg every other day for      a week and then off. She was warned about things to look for when      she tapers and side effects.  3. Start vitamin B12 1000 mcg weekly and then will work out a      schedule.  4. Recheck CBC, hepatic function panel and B12 level in a month.  5. May be able to figure out an oral or nasal regimen for vitamin B12      once the injections take effect.  6. Routine followup in 2 months, sooner if needed.     Iva Boop, MD,FACG  Electronically Signed    CEG/MedQ  DD: 05/08/2006  DT: 05/08/2006  Job #: [id number]   cc:   Lelon Perla, DO

## 2010-06-14 NOTE — H&P (Signed)
NAMEWYNELL, HALBERG               ACCOUNT NO.:  [id number]   MEDICAL RECORD NO.:  [id number]          PATIENT TYPE:  OBV   LOCATION:  0104                         FACILITY:  [facility]   PHYSICIAN:  Iva Boop, MD,FACGDATE OF BIRTH:  [birth year]   DATE OF ADMISSION:  02/10/2006  DATE OF DISCHARGE:                              HISTORY & PHYSICAL   CHIEF COMPLAINT:  Nausea, anorexia, weight loss, diarrhea, and weakness.   HISTORY:  Fatimata is a pleasant 45 year old white female known to Dr.  Leone Payor who was diagnosed with colitis in 2005 on biopsy, but colon had  looked normal at that time.  She was seen this fall with anemia and  intermittent rectal bleeding; and some abdominal cramping and underwent  a repeat colonoscopy 11/28/2005 which showed a left-sided colitis which  appear to be fairly confluent.  Biopsies were consistent with IBD  showing chronic active inflammation with cryptitis.  She has been placed  on Asacol 6 tablets per day; and was doing okay with minimal symptoms.   She now had onset about 2 weeks ago with nausea, anorexia, malaise; and  has had a subsequent weight loss of 15 pounds.  She has been hot-and-  cold, but has not had any documented fever at home; denies any dysphagia  or odynophagia, myalgias, arthralgias, rash, headache, etcetera.  She  says she just has no energy and no appetite and cannot eat.  She started  with diarrhea since Friday, 02/06/2006 with 10-12 bowel movements per  day, some associated abdominal discomfort and cramping.  She says she  had one bloody stool on Saturday and one on Sunday; but has not noted  any blood since.  She denies any dysuria, hematuria, etcetera.  She does  remember that she did have a nonproductive cough at the onset of her  current illness which has improved.  She stopped the Asacol over the  past week, thought that her symptoms could be related.  She came to the  ER on 01/11; was hydrated, and discharged home.  Her  potassium was 3.1  at that time.  WBC of 9.6, hemoglobin 11.6.  She called back, today,  feeling worse; and was advised to come to the emergency room is seen,  and admitted for hydration and further diagnostic evaluation.   CURRENT MEDICATIONS:  Vitamins and Asacol which is now on hold.   ALLERGIES:  PENICILLIN and ASPIRIN.   PAST HISTORY:  1. IBD diagnosed in 2005 with left-sided colitis November 2007.  2. Allergic rhinosinusitis.  3. Spontaneous a pneumothorax on the right 2004.   FAMILY HISTORY:  The patient is adopted.  She says her birth mother did  have diverticular disease.  There is no family history of IBD that she  is aware of.   SOCIAL HISTORY:  The patient is married, has two children.  She is  employed.  She is an ex-smoker, social EtOH use.  Husband did have strep  2 weeks ago; children have not been ill.   REVIEW OF SYSTEMS:  CARDIOVASCULAR:  Denies any chest pain or anginal  symptoms.  PULMONARY:  Pertinent for dry cough at onset of her current  symptoms, and mild shortness of breath, currently with exertion.  HEENT:  Denies any sinus symptoms, sore throat etcetera. GENITOURINARY:  Denies  any dysuria, urgency, or frequency.  GI: As above.  MUSCULOSKELETAL:  Positive for mild knee pain bilaterally.  NEURO: Denies any headache.  She has had some dizziness with standing in the past couple of days.   PHYSICAL EXAM:  GENERAL:  Well-developed, ill-appearing white female in  no acute distress.  VITAL SIGNS:  Temperature 97, blood pressure 105/60, pulse is 116.  Saturation is 99.  HEENT: She is anicteric.  Oral mucosa is dry.  NECK:  Supple without nodes.  CARDIOVASCULAR:  Tachy, regular rhythm with S1-S2.  PULMONARY:  Essentially clear.  ABDOMEN:  Soft and nondistended.  Bowel sounds are active.  She is  tender in the left abdomen, left mid quadrant, left lower quadrant.  There is no guarding or rebound.  No masses or hepatosplenomegaly.  RECTAL:  Exam is not done  at this time.  EXTREMITIES:  Without clubbing, cyanosis or edema.  There is no rash or  lesions noted.  NEURO:  Nonfocal.   IMPRESSION:  30. A 45 year old white female with a 2-week history of illness with      nausea, anorexia, malaise, questionable fevers, associated weight      loss, and now with 4-day history of diarrhea and intermittent      hematochezia.  Suspect that she has a viral syndrome, rule out      pneumonia, rule out drug reaction to Asacol.  2. Ulcerative colitis questions superimposed infectious colitis.  3. Hypokalemia secondary to above.  4. Dehydration secondary to above.  5. History of spontaneous pneumothorax 2004.   PLAN:  The patient is admitted to the service of Dr. Stan Head for  observation and rehydration.  Will check chest x-ray, abdominal films,  baseline labs, stool cultures, place her on antibiotics, clear liquid  diet; hold her Asacol for the time being; and further workup pending the  above results.      Amy [place], PA-C      Iva Boop, MD,FACG  Electronically Signed    AE/MEDQ  D:  02/10/2006  T:  02/10/2006  Job:  [id number]   cc:   Loreen Freud, M.D.  [address] [place]  [place] [address]

## 2010-06-14 NOTE — Consult Note (Signed)
   NAMEJAHMIYAH, [name] [name]                         ACCOUNT NO.:  [id number]   MEDICAL RECORD NO.:  [id number]                   PATIENT TYPE:  INP   LOCATION:  0363                                 FACILITY:  [facility]   PHYSICIAN:  Mikey Bussing, M.D.           DATE OF BIRTH:  Jul 18, 1965   DATE OF CONSULTATION:  08/29/2002  DATE OF DISCHARGE:                                   CONSULTATION   REASON FOR CONSULTATION:  Right spontaneous pneumothorax with persistent air  leak following chest tube placement.   CHIEF COMPLAINT:  Chest pain.   REFERRING PHYSICIAN:  Charlaine Dalton. Sherene Sires, M.D.   PRIMARY CARE PHYSICIAN:  Wanda Plump, MD, [facility].   HISTORY OF PRESENT ILLNESS:  I was asked to evaluate this 45 year old white  female for potential right VATS and pleurodesis for treatment of a  spontaneous right pneumothorax which is not resolved after tube  thoracostomy.  The patient presented three days ago with acute onset of  pleuritic chest pain.  She denies any trauma or significant aggravating  events, although she has been moving from one house to another.  She was  evaluated by Dr. Sherene Sires and found to have a significant right pneumothorax in  the 25% range.  An elective right chest tube was placed by Dr. Sherene Sires with  improvement in the pneumothorax but not complete resolution.  Follow-up  chest x-rays since tube placement has shown residual 10 to 15% right apical  pneumothorax.   Dictation ended here.                                               Mikey Bussing, M.D.    PV/MEDQ  D:  08/29/2002  T:  08/30/2002  Job:  [id number]   cc:   Wanda Plump, MD [facility]  [phone] [address] [place], [place] [address]

## 2010-06-14 NOTE — Assessment & Plan Note (Signed)
[facility]                           GASTROENTEROLOGY OFFICE NOTE   NAME:Gluth, DANDREA MEDDERS                      MRN:          [id number]  DATE:10/28/2005                            DOB:          [birth year]    CHIEF COMPLAINT:  Anemia and pressure bleeding.   HISTORY OF PRESENT ILLNESS:  Ms. Palen is a lady I saw before who had some  microscopic colitis-type changes on a colonoscopy and rectal bleeding.  I  started her on Asacol.  She has stopped that.  She apparently did well for a  reasonable while after that August 2005 colonoscopy.  She did have internal  hemorrhoids seen there.  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  Subsequent to that, she has been having problems with some crampy left lower  quadrant pain, bloating, defecation and passing blood.  She has not really  been constipated.  She was found to be anemic with a hemoglobin around 8  earlier this summer.  She does still menstruate.   Her hemoglobin has risen to 12 and her MCV has normalized with iron therapy  coordinated by Dr. Laury Axon.  She does not really have significant abdominal  pain; it is more of a pressure.   Her review of systems is negative for fever.  There is no eye  symptomatology, lung problems, skin rash, musculoskeletal or joint problems.   MEDICATIONS:  Multivitamin, iron.  She is on two twice a day.  B-complex and  calcium.   She is allergic to PENICILLIN and ASPIRIN.   PAST MEDICAL HISTORY:  The indeterminate colitis and internal hemorrhoids.  History of spontaneous pneumothorax treated with video-assisted  thoracoscopic surgery and allergic rhinosinusitis.   PHYSICAL EXAMINATION:  Weight 128 pounds, pulse 80, blood pressure 110/68.  Abdomen is soft and nontender.  No mass.   ASSESSMENT:  Rectal bleeding and hematochezia with some bowel habit changes.  There is a history of colitis on biopsies.  Her colonoscopy really looked  normal but I had taken  random biopsies because of symptoms of diarrhea and  bleeding.  She could have inflammatory bowel disease.  She could have  hemorrhoidal bleeding with irritable bowel syndrome.   RECOMMENDATIONS/PLAN:  I think it is important to try to sort this out  further and to have a real firm diagnosis which we did not have before.  I  have recommended flexible sigmoidoscopy and would plan for random biopsy.  She understands and agrees to proceed.  If the left colon and biopsies are  entirely normal and there are hemorrhoids, I think this may be more like  irritable bowel symptomatology.  Further plans pending that result.  A  consideration for antibiotic therapy for possible small bowel bacterial  overgrowth will be given as well.       Iva Boop, MD,FACG      CEG/MedQ  DD:  10/28/2005  DT:  10/30/2005  Job #:  [id number]   cc:   Loreen Freud, M.D.

## 2010-09-09 ENCOUNTER — Telehealth: Payer: Self-pay

## 2010-09-09 NOTE — Telephone Encounter (Signed)
Message copied by Annett Fabian on Mon Sep 09, 2010  3:41 PM ------      Message from: Iva Boop      Created: Sun Sep 08, 2010  4:54 PM      Regarding: overdue for labs       Was supposed to get cbc in June - please check up on her

## 2010-09-09 NOTE — Telephone Encounter (Signed)
I have left a message for the patient to call back about lab work and symptoms

## 2010-09-11 NOTE — Telephone Encounter (Signed)
I did speak with the patient .  Her daughter was just diagnosed with UC and has been inpatient at [facility] for the last several weeks with UC and c-diff.  The patient will come for labs in the next few weeks.  Her symptoms are fine, she is having no problems.

## 2010-10-08 ENCOUNTER — Telehealth: Payer: Self-pay | Admitting: Internal Medicine

## 2010-10-08 NOTE — Telephone Encounter (Signed)
If this persists start prednisone 20 mg daily Schedule REV next week (extender I guess) when she is back

## 2010-10-08 NOTE — Telephone Encounter (Signed)
Patient advised she is scheduled for an appt with Willette Cluster RNP on 10/15/10 8:30. She is advised of  Dr Marvell Fuller recommendations

## 2010-10-08 NOTE — Telephone Encounter (Signed)
She is out of town for work.  She started having constipation then had lots of urgency and loose stool alternating with diarrhea.  She is also having nausea.  She will be back in town on Monday and will come for lab work then.  She is on azathioprine 75 mg.  She does have some prednisone with her if needed,.  Please advise

## 2010-10-15 ENCOUNTER — Ambulatory Visit (INDEPENDENT_AMBULATORY_CARE_PROVIDER_SITE_OTHER): Payer: BC Managed Care – PPO | Admitting: Nurse Practitioner

## 2010-10-15 ENCOUNTER — Other Ambulatory Visit: Payer: BC Managed Care – PPO

## 2010-10-15 ENCOUNTER — Encounter: Payer: Self-pay | Admitting: Nurse Practitioner

## 2010-10-15 ENCOUNTER — Other Ambulatory Visit (INDEPENDENT_AMBULATORY_CARE_PROVIDER_SITE_OTHER): Payer: BC Managed Care – PPO

## 2010-10-15 VITALS — BP 106/58 | HR 72 | Ht 66.0 in | Wt 125.0 lb

## 2010-10-15 DIAGNOSIS — Z9119 Patient's noncompliance with other medical treatment and regimen: Secondary | ICD-10-CM

## 2010-10-15 DIAGNOSIS — K5100 Ulcerative (chronic) pancolitis without complications: Secondary | ICD-10-CM

## 2010-10-15 DIAGNOSIS — Z79899 Other long term (current) drug therapy: Secondary | ICD-10-CM

## 2010-10-15 DIAGNOSIS — K5190 Ulcerative colitis, unspecified, without complications: Secondary | ICD-10-CM

## 2010-10-15 LAB — CBC WITH DIFFERENTIAL/PLATELET
Basophils Absolute: 0 10*3/uL (ref 0.0–0.1)
Eosinophils Absolute: 0.1 10*3/uL (ref 0.0–0.7)
HCT: 40.7 % (ref 36.0–46.0)
Hemoglobin: 13.3 g/dL (ref 12.0–15.0)
Lymphs Abs: 1.2 10*3/uL (ref 0.7–4.0)
MCHC: 32.6 g/dL (ref 30.0–36.0)
MCV: 87.3 fl (ref 78.0–100.0)
Monocytes Absolute: 0.3 10*3/uL (ref 0.1–1.0)
Neutro Abs: 7.2 10*3/uL (ref 1.4–7.7)
RDW: 17.9 % — ABNORMAL HIGH (ref 11.5–14.6)

## 2010-10-15 LAB — COMPREHENSIVE METABOLIC PANEL
ALT: 16 U/L (ref 0–35)
CO2: 32 mEq/L (ref 19–32)
Creatinine, Ser: 0.6 mg/dL (ref 0.4–1.2)
GFR: 122.4 mL/min (ref >60.00)
Total Bilirubin: 0.5 mg/dL (ref 0.3–1.2)

## 2010-10-15 MED ORDER — PREDNISONE (PAK) 10 MG PO TABS: ORAL_TABLET | ORAL | Status: DC

## 2010-10-15 MED ORDER — ONDANSETRON HCL 4 MG PO TABS: ORAL_TABLET | ORAL | Status: DC

## 2010-10-15 NOTE — Patient Instructions (Signed)
Please go to the basement level to have your labs drawn.  Drink 7-8- glasses of fluids daily. We sent a prescription for Zofran 4 mg and Prednisone 20 mg tab to [facility].  The pt is calling back to make a follow up appointment with Dr. Leone Payor.

## 2010-10-15 NOTE — Progress Notes (Signed)
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 Leone Payor. She was last seen in May at which time she was doing well on a Prednisone taper and Azathioprine. Her daughter was diagnosed with ulcerative colitis in June, things "got crazy" and patient wasn't compliant with her Azathioprine. She has been back on it for about a month and a half. Two weeks ago developed alternating constipation and diarrhea then last week stools became bloody and loose with associated urgency and tenesmus. No fevers, occasional nausea. She called office last week and was started on 20mg  Prednisone. Overall better, stools firming, bleeding has decreased but still having frequent stools. No recent antibiotics.     Current Medications, Allergies, Past Medical History, Past Surgical History, Family History and Social History were reviewed in [employer] record.   PHYSICAL EXAMINATION : General: Thin white female in no acute distress Head: Normocephalic and atraumatic Eyes:  sclerae anicteric,conjunctive pink. Ears: Normal auditory acuity Mouth: Moist mucous membranes Neck: Supple, no masses.  Lungs: Clear throughout to auscultation Heart: Regular rate and rhythm; no murmurs heard Abdomen: Soft, nondistended, mild diffuse lower tenderness. No masses or hepatomegaly noted. Normal bowel sounds Rectal: Not done. Musculoskeletal: Symmetrical with no gross deformities  Skin: No lesions on visible extremities Extremities: No edema or deformities noted Neurological: Alert oriented x 4, grossly nonfocal Cervical Nodes:  No significant cervical adenopathy Psychological:  Alert and cooperative. Normal mood and affect  ASSESSMENT AND PLAN :

## 2010-10-16 ENCOUNTER — Telehealth: Payer: Self-pay | Admitting: *Deleted

## 2010-10-16 ENCOUNTER — Other Ambulatory Visit: Payer: Self-pay | Admitting: *Deleted

## 2010-10-16 DIAGNOSIS — K5090 Crohn's disease, unspecified, without complications: Secondary | ICD-10-CM

## 2010-10-16 MED ORDER — POTASSIUM CHLORIDE CRYS ER 20 MEQ PO TBCR: EXTENDED_RELEASE_TABLET | ORAL | Status: DC

## 2010-10-16 NOTE — Telephone Encounter (Signed)
Pt called me back and I spoke to her.  I told her she needs to take Potassium 20 MEQ, 1 tab twice daily then 1 tab x 5 days. She is to then come to our lab on Mon 9-24.  The pt will be out of town for work on Mon 24th thru [employer].  She said she cannot come to repeat the potassium lab until Friday 10-25-2010.  I asked if the diarrhea has decreased at all.  She said during the night she gets up every hour with some diarrhea, low volume, small amounts.  During the day she has a small amount of diarrhea every few hours.

## 2010-10-16 NOTE — Telephone Encounter (Signed)
I did call the patient on her home and cell phone. I left a message that her potassium was low and we need to start her on potassium . She is to take 1 20 MEQ tab twice daily x 2 days then one tablet for 5 days. I sent a prescription for the K-DUR to [facility]. I asked her to call us back. Gunnar Fusi would like to know if her diarrhea has decreased at all.  I left my name and our number.

## 2010-10-17 ENCOUNTER — Encounter: Payer: Self-pay | Admitting: Nurse Practitioner

## 2010-10-17 NOTE — Assessment & Plan Note (Addendum)
Unfortunately patient was not compliant with azathioprine over the summer, she took it sporadically, see history of present illness. Patient has been back on azathioprine for a month and a half but has been having abdominal pain and bloody diarrhea for almost 2 weeks. After calling the office on 10/08/10 patient has been on 20 mg of prednisone daily with some improvement in symptoms. Will obtain laboratory studies today. Reiterated importance of being compliant with medications. Continue prednisone 20 mg daily until reevaluation in a couple of weeks. I don't think the patient is dehydrated based on her exam but her fluid intake is inadequate. Encouraged 7-8 glasses of water a day. We will call her with lab results.

## 2010-10-18 ENCOUNTER — Telehealth: Payer: Self-pay | Admitting: Internal Medicine

## 2010-10-18 NOTE — Progress Notes (Signed)
I agree with the plan.  Let's given pred and azathiaprine some more time to work.

## 2010-10-18 NOTE — Telephone Encounter (Signed)
I spoke with the patient and she has not started on the potassium that was called in she says the pills are too big.  I did speak with the pharmacy and she has not picked up the rx.  I have had them change it to an elixir.  I spoke with the pharmacy and they will change the rx.  She is asked to go pick it up and get started on it today.

## 2010-10-22 ENCOUNTER — Telehealth: Payer: Self-pay | Admitting: Internal Medicine

## 2010-10-22 NOTE — Telephone Encounter (Signed)
Patient reports she is not feeling any better.  Up multiple times a night with loose stool.  Was getting better over the weekend now feeling much worse worse.  Having occasional vomiting.  More weight loss since appt.  Abdomen is tender and sore.  She is wondering what else she can do .  Currently taking zofran for nausea, azathioprine, and prednisone 20 mg.  She is out of town in [place] she will return on Friday.

## 2010-10-22 NOTE — Telephone Encounter (Signed)
Increase prednisone to 40 mg daily, keep fluids going - must have fluid intake to more than keep up with losses - gatorade, powerade. Needs assessment Friday or Monday and may end up needing admission

## 2010-10-22 NOTE — Telephone Encounter (Signed)
Unable to speak with the patient her cell phone does not have voicemail set up on it.  I have spoken with her husband and he will relay all info to the patient tonight when he speaks with her.  I have given her husband an appt for Friday with Amy Esterwood PA at 9:00.  He will have the patient call me back if she has questions.  I stressed to him the importance of forcing fluids and making sure her intake is more than her output.  He verbalized understanding of all instructions.

## 2010-10-25 ENCOUNTER — Encounter: Payer: Self-pay | Admitting: Physician Assistant

## 2010-10-25 ENCOUNTER — Telehealth: Payer: Self-pay | Admitting: *Deleted

## 2010-10-25 ENCOUNTER — Other Ambulatory Visit (INDEPENDENT_AMBULATORY_CARE_PROVIDER_SITE_OTHER): Payer: BC Managed Care – PPO

## 2010-10-25 ENCOUNTER — Ambulatory Visit (INDEPENDENT_AMBULATORY_CARE_PROVIDER_SITE_OTHER): Payer: BC Managed Care – PPO | Admitting: Physician Assistant

## 2010-10-25 ENCOUNTER — Telehealth: Payer: Self-pay

## 2010-10-25 DIAGNOSIS — K5190 Ulcerative colitis, unspecified, without complications: Secondary | ICD-10-CM

## 2010-10-25 DIAGNOSIS — K5090 Crohn's disease, unspecified, without complications: Secondary | ICD-10-CM

## 2010-10-25 DIAGNOSIS — E876 Hypokalemia: Secondary | ICD-10-CM

## 2010-10-25 LAB — BASIC METABOLIC PANEL
CO2: 32 mEq/L (ref 19–32)
Chloride: 100 mEq/L (ref 96–112)
Creatinine, Ser: 0.4 mg/dL (ref 0.4–1.2)
Potassium: 2.8 mEq/L — CL (ref 3.5–5.1)
Sodium: 143 mEq/L (ref 135–145)

## 2010-10-25 LAB — CBC WITH DIFFERENTIAL/PLATELET
Basophils Absolute: 0 10*3/uL (ref 0.0–0.1)
Eosinophils Relative: 1.2 % (ref 0.0–5.0)
HCT: 28.3 % — ABNORMAL LOW (ref 36.0–46.0)
Hemoglobin: 9.3 g/dL — ABNORMAL LOW (ref 12.0–15.0)
Lymphs Abs: 0.9 10*3/uL (ref 0.7–4.0)
Monocytes Relative: 3.2 % (ref 3.0–12.0)
Neutro Abs: 3.2 10*3/uL (ref 1.4–7.7)
RDW: 16.8 % — ABNORMAL HIGH (ref 11.5–14.6)

## 2010-10-25 MED ORDER — DICYCLOMINE HCL 10 MG PO CAPS: 10.0000 mg | ORAL_CAPSULE | Freq: Two times a day (BID) | ORAL | Status: DC | PRN

## 2010-10-25 NOTE — Telephone Encounter (Signed)
Darcey Nora RN CGRN had spoken to the patient on the phone.  Sheri made the patient an appointment to see Mike Gip PA on Fri 10-25-2010 at 9AM.

## 2010-10-25 NOTE — Telephone Encounter (Signed)
Message copied by Annett Fabian on Fri Oct 25, 2010  5:05 PM ------      Message from: [place], [place] S      Created: Fri Oct 25, 2010  3:07 PM       Please call Bronson Curb, let her know k+ still low. She needs to tka kdur 20 meq twice daily x 5 days then get repeat bmet on  tues afternoon next week

## 2010-10-25 NOTE — Patient Instructions (Addendum)
We have sent a prescription for Bentyl 10 mg to [place], [facility]. Continue the medications same as you are doing. Stay on Prednisone 40 mg for 2 weeks, then go to 30 mg, ( 1 1/2 tab) daily.  Follow up with Dr. Leone Payor in 2-3 weeks.

## 2010-10-25 NOTE — Telephone Encounter (Signed)
Patient advised.

## 2010-10-25 NOTE — Progress Notes (Signed)
Subjective:    Patient ID: [name] [name], female    DOB: 06-13-1965, 45 y.o.   MRN: [id number]  HPI [name] [name] is a 45 year old white female a known to Dr. Leone Payor with history of universal ulcerative colitis. She was seen last about 10 days ago with an exacerbation of her colitis. She had been off of her azathioprine and had started it back about a month previous but had developed a flare nonetheless with abdominal pain diarrhea and urgency. She was started on prednisone 20 mg by mouth daily and continued on her azathioprine. Labs on 920 showed a potassium of 2.3 TBC was 8.8 hemoglobin of 13.3. She has been placed on the potassium supplement in solution form and says that she's been taking in over the past week. She called the a few days ago stating that she was feeling worse again with increase in diarrhea cramping and urgency and her prednisone was increased to 40 mg daily which she's now been on over the past 3 days.  She comes in today stating that she is feeling a little bit better over the past couple of days her nausea has decreased significantly and she has been able to eat 3 small meals daily. She vomited once earlier this week. She has not had any fever that she is aware of. She says she had been having a lot of nocturnal episodes of diarrhea but this has decreased and last night had 3 episodes of nighttime diarrhea. She says she gets urgency and tenesmus and then just passes a small amount of light liquid stool. During the day, she's having small amounts of diarrhea as well though no rectal bleeding. She had been out of town last week for work which she says didn't help and does not have to travel again in the next couple of weeks.    Review of Systems  Constitutional: Positive for fatigue.  HENT: Negative.   Respiratory: Negative.   Cardiovascular: Negative.   Gastrointestinal: Positive for nausea, abdominal pain and diarrhea.  Genitourinary: Negative.   Musculoskeletal: Negative.     Skin: Negative.   Neurological: Negative.   Hematological: Negative.   Psychiatric/Behavioral: Negative.    Outpatient Prescriptions Prior to Visit  Medication Sig Dispense Refill  . Calcium Carbonate Antacid (TUMS PO) Take by mouth. 2 tablets by mouth once daily as needed      . Multiple Vitamin (MULTIVITAMIN) tablet Take 1 tablet by mouth daily.        . ondansetron (ZOFRAN) 4 MG tablet Take 1 tab every 6 hours as needed for nausea.  30 tablet  1  . predniSONE (STERAPRED UNI-PAK) 10 MG tablet Take 1 tab daily   30 tablet  1  . azaTHIOprine (IMURAN) 50 MG tablet Take 1 tablet (50 mg total) by mouth daily. Take 1 1/2 tablets by mouth daily  45 tablet  2  . dicyclomine (BENTYL) 10 MG capsule 10 mg. One capsule by mouth daily       . Calcium Carbonate-Vitamin D (CALTRATE 600+D) 600-400 MG-UNIT per chew tablet Chew 1 tablet by mouth 2 (two) times daily.        . cyclobenzaprine (FLEXERIL) 5 MG tablet Take 5 mg by mouth 3 (three) times daily as needed.        Marland Kitchen HYDROcodone-acetaminophen (VICODIN) 5-500 MG per tablet 1 tablet. 1-2 tablets by mouth in afternoon and at bedtime for pain with sleeping       . potassium chloride SA (K-DUR,KLOR-CON) 20 MEQ tablet Take 20  mEq by mouth daily.        . potassium chloride SA (K-DUR,KLOR-CON) 20 MEQ tablet Take 2 tab for 2 days then go to one tab for 5 days.  9 tablet  0  . promethazine (PHENERGAN) 25 MG tablet Take 25 mg by mouth every 6 (six) hours as needed.         Allergies  Allergen Reactions  . Aspirin   . Penicillins        Objective:   Physical Exam Well-developed thin white female in no acute distress, alert oriented x3, pleasant Blood pressure 86/58 pulse 84, HEENT; nontraumatic normocephalic EOMI PERRLA sclera anicteric,Neck; Supple no JVD, Cardiovascular; regular rate and rhythm with S1-S2 no murmur or gallop, Pulmonary; clear bilaterally, Abdomen; soft she has mild diffuse tendernes,s no guarding, no rebound, no palpable mass or  hepatosplenomegaly, bowel sounds active, Rectal; not done, extremities, no clubbing, cyanosis or edema skin warm and dry without lesions, Psych; mood and affect normal an appropriate.        Assessment & Plan:  #53 45 year old female with known universal ulcerative colitis with recent exacerbation, now improving on prednisone 40 mg daily.  #2 Hypokalemia  #3 history of medication noncompliance  Plan; check CBC and BMET today Continue I azathioprine 75 mg daily Continue prednisone 40 mg by mouth daily x2 weeks then if doing well she is advised she can decrease to 30 mg daily and will remain at that dose until she is seen in return office visit in 2-3 weeks. Add Bentyl 10 mg by mouth twice daily as needed for urgency and tenesmus Continue K. Dur solution solution 20 mEq daily until what today's labs are reviewed. Return office visit with Dr. Leone Payor in 2-3 weeks, patient is advised to call in the interim for any problems.

## 2010-10-25 NOTE — Telephone Encounter (Signed)
I advised pt I tired to look for a follow up appt with Dr. Leone Payor in 3 weeks. None available.  I sent myself a reminder to make appt later and told pt she can call for one as well.

## 2010-10-28 NOTE — Progress Notes (Signed)
I agree with assessment and plans 

## 2010-10-29 ENCOUNTER — Other Ambulatory Visit (INDEPENDENT_AMBULATORY_CARE_PROVIDER_SITE_OTHER): Payer: BC Managed Care – PPO

## 2010-10-29 DIAGNOSIS — D649 Anemia, unspecified: Secondary | ICD-10-CM

## 2010-10-29 LAB — CBC WITH DIFFERENTIAL/PLATELET
Basophils Relative: 0.1 % (ref 0.0–3.0)
Eosinophils Relative: 0.7 % (ref 0.0–5.0)
HCT: 31.3 % — ABNORMAL LOW (ref 36.0–46.0)
Hemoglobin: 9.9 g/dL — ABNORMAL LOW (ref 12.0–15.0)
Lymphs Abs: 0.6 10*3/uL — ABNORMAL LOW (ref 0.7–4.0)
MCV: 87.6 fl (ref 78.0–100.0)
Monocytes Absolute: 0.1 10*3/uL (ref 0.1–1.0)
Monocytes Relative: 1.4 % — ABNORMAL LOW (ref 3.0–12.0)
RBC: 3.57 Mil/uL — ABNORMAL LOW (ref 3.87–5.11)
WBC: 5.2 10*3/uL (ref 4.5–10.5)

## 2010-10-30 ENCOUNTER — Other Ambulatory Visit: Payer: Self-pay | Admitting: Nurse Practitioner

## 2010-10-30 ENCOUNTER — Other Ambulatory Visit: Payer: Self-pay | Admitting: Internal Medicine

## 2010-10-30 MED ORDER — ONDANSETRON HCL 4 MG PO TABS: ORAL_TABLET | ORAL | Status: DC

## 2010-10-30 NOTE — Telephone Encounter (Signed)
Talked to the pharmacy to see if there were any refills on Zofran Rx sent in 10/15/10. She stated medication was phoned in with # 30 0 refills. I told her that in the system it was to be with 1 refill but ok'ed per Dr. Leone Payor to refill so #30 with 1 refill phoned in today.

## 2010-10-30 NOTE — Telephone Encounter (Signed)
Ok to refill as it was with one more refill also

## 2010-10-30 NOTE — Telephone Encounter (Signed)
Patient called questioning if she is to stay on Zofran given 10/15/10 by Gunnar Fusi. She states she have no refills. Do you want to refill this medication?

## 2010-10-30 NOTE — Progress Notes (Signed)
Quick Note:  This is stable - chart review indicates she was to have a recheck of K+ (BMET) - please arrange that and no charge the CBC if this is corect ______

## 2010-10-31 ENCOUNTER — Other Ambulatory Visit: Payer: Self-pay | Admitting: Internal Medicine

## 2010-10-31 ENCOUNTER — Other Ambulatory Visit: Payer: Self-pay

## 2010-10-31 ENCOUNTER — Other Ambulatory Visit (INDEPENDENT_AMBULATORY_CARE_PROVIDER_SITE_OTHER): Payer: BC Managed Care – PPO

## 2010-10-31 DIAGNOSIS — E876 Hypokalemia: Secondary | ICD-10-CM

## 2010-10-31 LAB — BASIC METABOLIC PANEL
BUN: 11 mg/dL (ref 6–23)
CO2: 29 mEq/L (ref 19–32)
Calcium: 8.4 mg/dL (ref 8.4–10.5)
GFR: 107.06 mL/min (ref >60.00)
Glucose, Bld: 113 mg/dL — ABNORMAL HIGH (ref 70–99)

## 2010-11-01 ENCOUNTER — Other Ambulatory Visit: Payer: Self-pay | Admitting: *Deleted

## 2010-11-01 MED ORDER — AZATHIOPRINE 50 MG PO TABS: 50.0000 mg | ORAL_TABLET | Freq: Every day | ORAL | Status: DC

## 2010-11-04 ENCOUNTER — Telehealth: Payer: Self-pay | Admitting: *Deleted

## 2010-11-04 ENCOUNTER — Encounter (HOSPITAL_COMMUNITY): Payer: BC Managed Care – PPO | Admitting: Psychiatry

## 2010-11-04 NOTE — Telephone Encounter (Signed)
Error

## 2010-11-14 ENCOUNTER — Other Ambulatory Visit (INDEPENDENT_AMBULATORY_CARE_PROVIDER_SITE_OTHER): Payer: BC Managed Care – PPO

## 2010-11-14 DIAGNOSIS — E876 Hypokalemia: Secondary | ICD-10-CM

## 2010-11-14 LAB — BASIC METABOLIC PANEL
BUN: 11 mg/dL (ref 6–23)
CO2: 26 mEq/L (ref 19–32)
Calcium: 8.2 mg/dL — ABNORMAL LOW (ref 8.4–10.5)
Creatinine, Ser: 0.6 mg/dL (ref 0.4–1.2)

## 2010-11-14 NOTE — Progress Notes (Signed)
Quick Note:  Labs are ok Make sure she has an appointment with me soon ______

## 2010-11-27 ENCOUNTER — Encounter: Payer: Self-pay | Admitting: Internal Medicine

## 2010-11-27 ENCOUNTER — Ambulatory Visit (INDEPENDENT_AMBULATORY_CARE_PROVIDER_SITE_OTHER): Payer: BC Managed Care – PPO | Admitting: Internal Medicine

## 2010-11-27 DIAGNOSIS — D899 Disorder involving the immune mechanism, unspecified: Secondary | ICD-10-CM

## 2010-11-27 DIAGNOSIS — K5100 Ulcerative (chronic) pancolitis without complications: Secondary | ICD-10-CM

## 2010-11-27 DIAGNOSIS — D500 Iron deficiency anemia secondary to blood loss (chronic): Secondary | ICD-10-CM

## 2010-11-27 MED ORDER — PREDNISONE (PAK) 10 MG PO TABS: ORAL_TABLET | ORAL | Status: AC

## 2010-11-27 MED ORDER — FERROUS SULFATE 325 (65 FE) MG PO TBEC: 325.0000 mg | DELAYED_RELEASE_TABLET | Freq: Every day | ORAL | Status: DC

## 2010-11-27 NOTE — Patient Instructions (Signed)
Return in 1 month to have your labs drawn. Return to see Dr. Leone Payor in 2 months, stop at the front desk to schedule that. Taper Prednisone as on medication list. Start over the counter iron supplement as discussed.

## 2010-11-27 NOTE — Progress Notes (Signed)
  Subjective:    Patient ID: [name] [name], female    DOB: 11/28/1965, 46 y.o.   MRN: [id number]  HPI 45 year old white woman with ulcerative colitis. Recent problems have included a flare treated with prednisone. She had been intermittently taking her azathioprine during the time this summer when her daughter was diagnosed with ulcerative colitis and was quite ill. Since that time she has been improving, having seen the extenders twice in my absence. She is now on 10 mg prednisone a day. Her stools are formed mostly with no bleeding and no significant abdominal pain and some weight gain. Overall she is feeling much better.   Review of Systems As above    Objective:   Physical Exam General: WDWN NAD Eyes: anicteric Lungs: clear Heart: S1S2 no rubs, murmurs or gallops Abdomen: soft and nontender, BS+ Ext: mild pedal edema          Assessment & Plan:

## 2010-11-27 NOTE — Assessment & Plan Note (Signed)
FeSO4 CBC and ferritin 1 month

## 2010-11-27 NOTE — Assessment & Plan Note (Addendum)
Significantly improved. Reviewed the importance of compliance with medications. Taper prednisone off over the next 2-3 weeks. Remain on azathioprine. See me in 2 months. Maintain every 3 month CBC and liver enzymes. Would be due next again in December.

## 2010-11-27 NOTE — Assessment & Plan Note (Signed)
She has had influenza vaccination at work this year. Documented.

## 2011-01-03 ENCOUNTER — Encounter: Payer: Self-pay | Admitting: Internal Medicine

## 2011-01-03 ENCOUNTER — Ambulatory Visit (INDEPENDENT_AMBULATORY_CARE_PROVIDER_SITE_OTHER): Payer: BC Managed Care – PPO | Admitting: Internal Medicine

## 2011-01-03 ENCOUNTER — Other Ambulatory Visit (INDEPENDENT_AMBULATORY_CARE_PROVIDER_SITE_OTHER): Payer: BC Managed Care – PPO

## 2011-01-03 ENCOUNTER — Other Ambulatory Visit: Payer: Self-pay | Admitting: Internal Medicine

## 2011-01-03 VITALS — BP 110/72 | HR 68 | Ht 66.0 in | Wt 139.6 lb

## 2011-01-03 DIAGNOSIS — E538 Deficiency of other specified B group vitamins: Secondary | ICD-10-CM

## 2011-01-03 DIAGNOSIS — Z79899 Other long term (current) drug therapy: Secondary | ICD-10-CM

## 2011-01-03 DIAGNOSIS — K5100 Ulcerative (chronic) pancolitis without complications: Secondary | ICD-10-CM

## 2011-01-03 DIAGNOSIS — D500 Iron deficiency anemia secondary to blood loss (chronic): Secondary | ICD-10-CM

## 2011-01-03 LAB — CBC WITH DIFFERENTIAL/PLATELET
Basophils Absolute: 0 10*3/uL (ref 0.0–0.1)
HCT: 31.2 % — ABNORMAL LOW (ref 36.0–46.0)
Lymphs Abs: 1.1 10*3/uL (ref 0.7–4.0)
MCV: 81.7 fl (ref 78.0–100.0)
Monocytes Absolute: 0.2 10*3/uL (ref 0.1–1.0)
Platelets: 178 10*3/uL (ref 150.0–400.0)
RDW: 16.9 % — ABNORMAL HIGH (ref 11.5–14.6)

## 2011-01-03 LAB — HEPATIC FUNCTION PANEL: Albumin: 3.7 g/dL (ref 3.5–5.2)

## 2011-01-03 LAB — FERRITIN: Ferritin: 3.5 ng/mL — ABNORMAL LOW (ref 10.0–291.0)

## 2011-01-03 MED ORDER — AZATHIOPRINE 50 MG PO TABS: 75.0000 mg | ORAL_TABLET | Freq: Every day | ORAL | Status: DC

## 2011-01-03 NOTE — Assessment & Plan Note (Signed)
CBC is pending from today stroke, we will add hepatic function panel as well.

## 2011-01-03 NOTE — Progress Notes (Signed)
  Subjective:    Patient ID: [name] [name], female    DOB: 25-Jan-1966, 45 y.o.   MRN: [id number]  HPI She presents today for followup of ulcerative colitis, blood loss anemia and chronic therapy with immunosuppressants. She is doing much better, gaining weight, denies abdominal pain diarrhea or bleeding. She is now off prednisone. She claims compliance with her medications.  Her daughter remains on azathioprine and tacrolimus for her ulcerative colitis and is overall improved. Allergies  Allergen Reactions  . Aspirin   . Penicillins    Outpatient Prescriptions Prior to Visit  Medication Sig Dispense Refill  . Calcium Carbonate Antacid (TUMS PO) Take by mouth. 2 tablets by mouth once daily as needed      . Calcium Carbonate-Vitamin D (CALTRATE 600+D) 600-400 MG-UNIT per chew tablet Chew 1 tablet by mouth 2 (two) times daily.        Marland Kitchen dicyclomine (BENTYL) 10 MG capsule Take 1 capsule (10 mg total) by mouth 2 (two) times daily as needed. One capsule by mouth daily  60 capsule  2  . ferrous sulfate 325 (65 FE) MG EC tablet Take 1 tablet (325 mg total) by mouth daily.  90 tablet  11  . Multiple Vitamin (MULTIVITAMIN) tablet Take 1 tablet by mouth daily.        Marland Kitchen azaTHIOprine (IMURAN) 50 MG tablet Take 75 mg by mouth daily. Take 1 1/2 tablets by mouth daily        Past Medical History  Diagnosis Date  . Ulcerative colitis     Anemia  . Vitamin B12 deficiency   . Osteoporosis   . Cellulitis of right groin   . Spontaneous pneumothorax 08/2002  . Internal hemorrhoids   . Hypokalemia   . Allergic rhinitis   . Sinusitis    Past Surgical History  Procedure Date  . Wisdom tooth extraction   . 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 Sigmoidoscopy 03/29/2010    mod-severe UC, also 2008 - severe UC, 2007 - UC  . Colonoscopy 08/30/2003    microscopic colitis, normal terminal ileum   History   Social History  . Marital Status: Married    Spouse Name: N/A    Number of Children: 2  . Years of  Education: N/A   Occupational History  . Distribution     [employer]  .     Social History Main Topics  . Smoking status: Former Smoker    Types: Cigarettes    Quit date: 01/28/2003  . Smokeless tobacco: Never Used  . Alcohol Use: 2.4 oz/week    4 Glasses of wine per week  . Drug Use: No  . Sexually Active: None   Other Topics Concern  . None   Social History Narrative  . None   Family History  Problem Relation Age of Onset  . Adopted: Yes        Review of Systems Some rhinorrhea    Objective:   Physical Exam General:  NAD Eyes:   anicteric Lungs:  clear Heart:  S1S2 no rubs, murmurs or gallops Abdomen:  soft and nontender, BS+ Ext:   no edema             Assessment & Plan:

## 2011-01-03 NOTE — Assessment & Plan Note (Signed)
>  1100 in March 2012 Will recheck in 2013 Not on supplementation

## 2011-01-03 NOTE — Assessment & Plan Note (Signed)
Doing well without symptoms. We'll continue her azathioprine at 75 mg daily and see me routinely in about 6 months.

## 2011-01-03 NOTE — Assessment & Plan Note (Signed)
She is on iron. Awaiting CBC and ferritin from today.

## 2011-01-03 NOTE — Patient Instructions (Addendum)
Your prescription(s) has(have) been sent to your pharmacy for you to pick up (Azothioprine). We will add a hepatic function panel (liver) to the labs you had drawn today. Return to see Dr. Leone Payor in 6 months.

## 2011-01-05 NOTE — Progress Notes (Signed)
Quick Note:  Hgb continue to slowly climb  She should stay on ferrous sulfate and increase to twice a day if she can   Other labs ok - no toxicity from azathioprine  Needs CBC, hepatic function panel, ferritin re: long-term use meds and anemia due to chronic blood loss in 3 months Please call and notify/order ______

## 2011-01-15 ENCOUNTER — Other Ambulatory Visit: Payer: Self-pay | Admitting: Gastroenterology

## 2011-01-15 DIAGNOSIS — Z79899 Other long term (current) drug therapy: Secondary | ICD-10-CM

## 2011-01-15 DIAGNOSIS — D500 Iron deficiency anemia secondary to blood loss (chronic): Secondary | ICD-10-CM

## 2011-03-10 ENCOUNTER — Other Ambulatory Visit: Payer: Self-pay | Admitting: Nurse Practitioner

## 2011-04-04 ENCOUNTER — Telehealth: Payer: Self-pay | Admitting: Gastroenterology

## 2011-04-04 NOTE — Telephone Encounter (Signed)
Called the patient and left a message on voicemail to remind the patient to come in to have her March labs drawn.

## 2011-05-05 ENCOUNTER — Telehealth: Payer: Self-pay | Admitting: Internal Medicine

## 2011-05-05 NOTE — Telephone Encounter (Signed)
Labs entered.

## 2011-06-30 ENCOUNTER — Encounter: Payer: Self-pay | Admitting: *Deleted

## 2011-07-04 ENCOUNTER — Encounter: Payer: Self-pay | Admitting: Internal Medicine

## 2011-07-04 ENCOUNTER — Ambulatory Visit (INDEPENDENT_AMBULATORY_CARE_PROVIDER_SITE_OTHER): Payer: BC Managed Care – PPO | Admitting: Internal Medicine

## 2011-07-04 ENCOUNTER — Other Ambulatory Visit (INDEPENDENT_AMBULATORY_CARE_PROVIDER_SITE_OTHER): Payer: BC Managed Care – PPO

## 2011-07-04 VITALS — BP 122/80 | HR 80 | Ht 66.75 in | Wt 148.8 lb

## 2011-07-04 DIAGNOSIS — K5100 Ulcerative (chronic) pancolitis without complications: Secondary | ICD-10-CM

## 2011-07-04 DIAGNOSIS — Z79899 Other long term (current) drug therapy: Secondary | ICD-10-CM

## 2011-07-04 DIAGNOSIS — D500 Iron deficiency anemia secondary to blood loss (chronic): Secondary | ICD-10-CM

## 2011-07-04 LAB — HEPATIC FUNCTION PANEL
AST: 16 U/L (ref 0–37)
Albumin: 4.3 g/dL (ref 3.5–5.2)

## 2011-07-04 LAB — CBC WITH DIFFERENTIAL/PLATELET
Basophils Absolute: 0 10*3/uL (ref 0.0–0.1)
HCT: 39.5 % (ref 36.0–46.0)
Hemoglobin: 13 g/dL (ref 12.0–15.0)
Lymphs Abs: 1 10*3/uL (ref 0.7–4.0)
MCHC: 33 g/dL (ref 30.0–36.0)
Monocytes Relative: 7 % (ref 3.0–12.0)
Neutro Abs: 3.4 10*3/uL (ref 1.4–7.7)
RDW: 14.6 % (ref 11.5–14.6)

## 2011-07-04 LAB — FERRITIN: Ferritin: 9.1 ng/mL — ABNORMAL LOW (ref 10.0–291.0)

## 2011-07-04 MED ORDER — AZATHIOPRINE 50 MG PO TABS: 75.0000 mg | ORAL_TABLET | Freq: Every day | ORAL | Status: DC

## 2011-07-04 MED ORDER — MOVIPREP 100 G PO SOLR: ORAL | Status: DC

## 2011-07-04 NOTE — Assessment & Plan Note (Signed)
F/u cbc today. 

## 2011-07-04 NOTE — Assessment & Plan Note (Addendum)
Improved and appears to be doing well. Continue azathioprine 75 mg daily. Followup lab testing today. Time for colonoscopy, it is about 8 years since the time of her diagnosis and I would like to assess for healing and also to start screening and surveillance for colorectal cancer.

## 2011-07-04 NOTE — Patient Instructions (Signed)
You have been scheduled for a colonoscopy with propofol. Please follow written instructions given to you at your visit today.  Please pick up your prep kit at the pharmacy within the next 1-3 days.  We have sent the following medications to your pharmacy for you to pick up at your convenience: Imuran

## 2011-07-04 NOTE — Assessment & Plan Note (Signed)
F/u labs today

## 2011-07-04 NOTE — Progress Notes (Signed)
  Subjective:    Patient ID: [name] [name], female    DOB: [birth year], 46 y.o.   MRN: [id number]  HPI She is a middle-aged white woman with ulcerative colitis. She reports no problems with diarrhea or rectal bleeding or abdominal pain. She has been gaining strength over time. She was last seen in December of 2012. She is a bit behind on her labs but did have her CBC and liver function tests drawn earlier today.  She reports that her daughter has had struggles with C. difficile on top of her inflammatory bowel disease but seems to be improving. The patient plans [name] [name] to the beach with her daughter later this month and then a full family trip in August. Work remains busy. Overall things are going well she reports. Medications, allergies, past medical history, past surgical history, family history and social history are reviewed and updated in the EMR.   Review of Systems As above    Objective:   Physical Exam General:  NAD Eyes:   anicteric Abdomen:  soft and nontender, BS+        Assessment & Plan:   1. UNIVERSAL ULCERATIVE COLITIS   2. Encounter for long-term (current) use of other high-risk medications   3. ANEMIA DUE TO CHRONIC BLOOD LOSS

## 2011-07-07 NOTE — Progress Notes (Signed)
Quick Note:  Let her know labs looking good - Hgb normal but iron not restored so needs to stay on that  Repeat CBC and LFT's in 3 months ______

## 2011-07-08 ENCOUNTER — Other Ambulatory Visit: Payer: Self-pay

## 2011-07-08 DIAGNOSIS — K5120 Ulcerative (chronic) proctitis without complications: Secondary | ICD-10-CM

## 2011-08-13 ENCOUNTER — Encounter: Payer: BC Managed Care – PPO | Admitting: Internal Medicine

## 2012-10-20 ENCOUNTER — Telehealth: Payer: Self-pay

## 2012-10-20 NOTE — Telephone Encounter (Signed)
LVM at work for [employer] number has a full mailbox HM-Tdap UTD, all other items need to be performed or records obtained from another provider.

## 2012-10-21 ENCOUNTER — Ambulatory Visit (INDEPENDENT_AMBULATORY_CARE_PROVIDER_SITE_OTHER): Payer: BC Managed Care – PPO | Admitting: Family Medicine

## 2012-10-21 ENCOUNTER — Encounter: Payer: Self-pay | Admitting: Family Medicine

## 2012-10-21 ENCOUNTER — Other Ambulatory Visit (HOSPITAL_COMMUNITY)
Admission: RE | Admit: 2012-10-21 | Discharge: 2012-10-21 | Disposition: A | Discharge status: Home / Self Care | Payer: BC Managed Care – PPO | Source: Ambulatory Visit | Attending: Family Medicine | Admitting: Family Medicine

## 2012-10-21 VITALS — BP 114/76 | HR 72 | Temp 97.9°F | Ht 65.5 in | Wt 156.6 lb

## 2012-10-21 DIAGNOSIS — K5100 Ulcerative (chronic) pancolitis without complications: Secondary | ICD-10-CM

## 2012-10-21 DIAGNOSIS — Z1211 Encounter for screening for malignant neoplasm of colon: Secondary | ICD-10-CM

## 2012-10-21 DIAGNOSIS — Z124 Encounter for screening for malignant neoplasm of cervix: Secondary | ICD-10-CM

## 2012-10-21 DIAGNOSIS — Z1151 Encounter for screening for human papillomavirus (HPV): Secondary | ICD-10-CM | POA: Insufficient documentation

## 2012-10-21 DIAGNOSIS — R87810 Cervical high risk human papillomavirus (HPV) DNA test positive: Secondary | ICD-10-CM | POA: Insufficient documentation

## 2012-10-21 DIAGNOSIS — Z01419 Encounter for gynecological examination (general) (routine) without abnormal findings: Secondary | ICD-10-CM | POA: Insufficient documentation

## 2012-10-21 DIAGNOSIS — Z0000 Encounter for general adult medical examination without abnormal findings: Secondary | ICD-10-CM

## 2012-10-21 NOTE — Patient Instructions (Addendum)
Preventive Care for Adults, Female A healthy lifestyle and preventive care can promote health and wellness. Preventive health guidelines for women include the following key practices.  A routine yearly physical is a good way to check with your caregiver about your health and preventive screening. It is a chance to share any concerns and updates on your health, and to receive a thorough exam.  Visit your dentist for a routine exam and preventive care every 6 months. Brush your teeth twice a day and floss once a day. Good oral hygiene prevents tooth decay and gum disease.  The frequency of eye exams is based on your age, health, family medical history, use of contact lenses, and other factors. Follow your caregiver's recommendations for frequency of eye exams.  Eat a healthy diet. Foods like vegetables, fruits, whole grains, low-fat dairy products, and lean protein foods contain the nutrients you need without too many calories. Decrease your intake of foods high in solid fats, added sugars, and salt. Eat the right amount of calories for you.Get information about a proper diet from your caregiver, if necessary.  Regular physical exercise is one of the most important things you can do for your health. Most adults should get at least 150 minutes of moderate-intensity exercise (any activity that increases your heart rate and causes you to sweat) each week. In addition, most adults need muscle-strengthening exercises on 2 or more days a week.  Maintain a healthy weight. The body mass index (BMI) is a screening tool to identify possible weight problems. It provides an estimate of body fat based on height and weight. Your caregiver can help determine your BMI, and can help you achieve or maintain a healthy weight.For adults 20 years and older:  A BMI below 18.5 is considered underweight.  A BMI of 18.5 to 24.9 is normal.  A BMI of 25 to 29.9 is considered overweight.  A BMI of 30 and above is  considered obese.  Maintain normal blood lipids and cholesterol levels by exercising and minimizing your intake of saturated fat. Eat a balanced diet with plenty of fruit and vegetables. Blood tests for lipids and cholesterol should begin at age 20 and be repeated every 5 years. If your lipid or cholesterol levels are high, you are over 50, or you are at high risk for heart disease, you may need your cholesterol levels checked more frequently.Ongoing high lipid and cholesterol levels should be treated with medicines if diet and exercise are not effective.  If you smoke, find out from your caregiver how to quit. If you do not use tobacco, do not start.  If you are pregnant, do not drink alcohol. If you are breastfeeding, be very cautious about drinking alcohol. If you are not pregnant and choose to drink alcohol, do not exceed 1 drink per day. One drink is considered to be 12 ounces (355 mL) of beer, 5 ounces (148 mL) of wine, or 1.5 ounces (44 mL) of liquor.  Avoid use of street drugs. Do not share needles with anyone. Ask for help if you need support or instructions about stopping the use of drugs.  High blood pressure causes heart disease and increases the risk of stroke. Your blood pressure should be checked at least every 1 to 2 years. Ongoing high blood pressure should be treated with medicines if weight loss and exercise are not effective.  If you are 55 to 46 years old, ask your caregiver if you should take aspirin to prevent strokes.  Diabetes   screening involves taking a blood sample to check your fasting blood sugar level. This should be done once every 3 years, after age 45, if you are within normal weight and without risk factors for diabetes. Testing should be considered at a younger age or be carried out more frequently if you are overweight and have at least 1 risk factor for diabetes.  Breast cancer screening is essential preventive care for women. You should practice "breast  self-awareness." This means understanding the normal appearance and feel of your breasts and may include breast self-examination. Any changes detected, no matter how small, should be reported to a caregiver. Women in their 20s and 30s should have a clinical breast exam (CBE) by a caregiver as part of a regular health exam every 1 to 3 years. After age 40, women should have a CBE every year. Starting at age 40, women should consider having a mammography (breast X-ray test) every year. Women who have a family history of breast cancer should talk to their caregiver about genetic screening. Women at a high risk of breast cancer should talk to their caregivers about having magnetic resonance imaging (MRI) and a mammography every year.  The Pap test is a screening test for cervical cancer. A Pap test can show cell changes on the cervix that might become cervical cancer if left untreated. A Pap test is a procedure in which cells are obtained and examined from the lower end of the uterus (cervix).  Women should have a Pap test starting at age 21.  Between ages 21 and 29, Pap tests should be repeated every 2 years.  Beginning at age 30, you should have a Pap test every 3 years as long as the past 3 Pap tests have been normal.  Some women have medical problems that increase the chance of getting cervical cancer. Talk to your caregiver about these problems. It is especially important to talk to your caregiver if a new problem develops soon after your last Pap test. In these cases, your caregiver may recommend more frequent screening and Pap tests.  The above recommendations are the same for women who have or have not gotten the vaccine for human papillomavirus (HPV).  If you had a hysterectomy for a problem that was not cancer or a condition that could lead to cancer, then you no longer need Pap tests. Even if you no longer need a Pap test, a regular exam is a good idea to make sure no other problems are  starting.  If you are between ages 65 and 70, and you have had normal Pap tests going back 10 years, you no longer need Pap tests. Even if you no longer need a Pap test, a regular exam is a good idea to make sure no other problems are starting.  If you have had past treatment for cervical cancer or a condition that could lead to cancer, you need Pap tests and screening for cancer for at least 20 years after your treatment.  If Pap tests have been discontinued, risk factors (such as a new sexual partner) need to be reassessed to determine if screening should be resumed.  The HPV test is an additional test that may be used for cervical cancer screening. The HPV test looks for the virus that can cause the cell changes on the cervix. The cells collected during the Pap test can be tested for HPV. The HPV test could be used to screen women aged 30 years and older, and should   be used in women of any age who have unclear Pap test results. After the age of 30, women should have HPV testing at the same frequency as a Pap test.  Colorectal cancer can be detected and often prevented. Most routine colorectal cancer screening begins at the age of 50 and continues through age 75. However, your caregiver may recommend screening at an earlier age if you have risk factors for colon cancer. On a yearly basis, your caregiver may provide home test kits to check for hidden blood in the stool. Use of a small camera at the end of a tube, to directly examine the colon (sigmoidoscopy or colonoscopy), can detect the earliest forms of colorectal cancer. Talk to your caregiver about this at age 50, when routine screening begins. Direct examination of the colon should be repeated every 5 to 10 years through age 75, unless early forms of pre-cancerous polyps or small growths are found.  Hepatitis C blood testing is recommended for all people born from 1945 through 1965 and any individual with known risks for hepatitis C.  Practice  safe sex. Use condoms and avoid high-risk sexual practices to reduce the spread of sexually transmitted infections (STIs). STIs include gonorrhea, chlamydia, syphilis, trichomonas, herpes, HPV, and human immunodeficiency virus (HIV). Herpes, HIV, and HPV are viral illnesses that have no cure. They can result in disability, cancer, and death. Sexually active women aged 25 and younger should be checked for chlamydia. Older women with new or multiple partners should also be tested for chlamydia. Testing for other STIs is recommended if you are sexually active and at increased risk.  Osteoporosis is a disease in which the bones lose minerals and strength with aging. This can result in serious bone fractures. The risk of osteoporosis can be identified using a bone density scan. Women ages 65 and over and women at risk for fractures or osteoporosis should discuss screening with their caregivers. Ask your caregiver whether you should take a calcium supplement or vitamin D to reduce the rate of osteoporosis.  Menopause can be associated with physical symptoms and risks. Hormone replacement therapy is available to decrease symptoms and risks. You should talk to your caregiver about whether hormone replacement therapy is right for you.  Use sunscreen with sun protection factor (SPF) of 30 or more. Apply sunscreen liberally and repeatedly throughout the day. You should seek shade when your shadow is shorter than you. Protect yourself by wearing long sleeves, pants, a wide-brimmed hat, and sunglasses year round, whenever you are outdoors.  Once a month, do a whole body skin exam, using a mirror to look at the skin on your back. Notify your caregiver of new moles, moles that have irregular borders, moles that are larger than a pencil eraser, or moles that have changed in shape or color.  Stay current with required immunizations.  Influenza. You need a dose every fall (or winter). The composition of the flu vaccine  changes each year, so being vaccinated once is not enough.  Pneumococcal polysaccharide. You need 1 to 2 doses if you smoke cigarettes or if you have certain chronic medical conditions. You need 1 dose at age 65 (or older) if you have never been vaccinated.  Tetanus, diphtheria, pertussis (Tdap, Td). Get 1 dose of Tdap vaccine if you are younger than age 65, are over 65 and have contact with an infant, are a healthcare worker, are pregnant, or simply want to be protected from whooping cough. After that, you need a Td   booster dose every 10 years. Consult your caregiver if you have not had at least 3 tetanus and diphtheria-containing shots sometime in your life or have a deep or dirty wound.  HPV. You need this vaccine if you are a woman age 26 or younger. The vaccine is given in 3 doses over 6 months.  Measles, mumps, rubella (MMR). You need at least 1 dose of MMR if you were born in 1957 or later. You may also need a second dose.  Meningococcal. If you are age 19 to 21 and a first-year college student living in a residence hall, or have one of several medical conditions, you need to get vaccinated against meningococcal disease. You may also need additional booster doses.  Zoster (shingles). If you are age 60 or older, you should get this vaccine.  Varicella (chickenpox). If you have never had chickenpox or you were vaccinated but received only 1 dose, talk to your caregiver to find out if you need this vaccine.  Hepatitis A. You need this vaccine if you have a specific risk factor for hepatitis A virus infection or you simply wish to be protected from this disease. The vaccine is usually given as 2 doses, 6 to 18 months apart.  Hepatitis B. You need this vaccine if you have a specific risk factor for hepatitis B virus infection or you simply wish to be protected from this disease. The vaccine is given in 3 doses, usually over 6 months. Preventive Services / Frequency Ages 19 to 39  Blood  pressure check.** / Every 1 to 2 years.  Lipid and cholesterol check.** / Every 5 years beginning at age 20.  Clinical breast exam.** / Every 3 years for women in their 20s and 30s.  Pap test.** / Every 2 years from ages 21 through 29. Every 3 years starting at age 30 through age 65 or 70 with a history of 3 consecutive normal Pap tests.  HPV screening.** / Every 3 years from ages 30 through ages 65 to 70 with a history of 3 consecutive normal Pap tests.  Hepatitis C blood test.** / For any individual with known risks for hepatitis C.  Skin self-exam. / Monthly.  Influenza immunization.** / Every year.  Pneumococcal polysaccharide immunization.** / 1 to 2 doses if you smoke cigarettes or if you have certain chronic medical conditions.  Tetanus, diphtheria, pertussis (Tdap, Td) immunization. / A one-time dose of Tdap vaccine. After that, you need a Td booster dose every 10 years.  HPV immunization. / 3 doses over 6 months, if you are 26 and younger.  Measles, mumps, rubella (MMR) immunization. / You need at least 1 dose of MMR if you were born in 1957 or later. You may also need a second dose.  Meningococcal immunization. / 1 dose if you are age 19 to 21 and a first-year college student living in a residence hall, or have one of several medical conditions, you need to get vaccinated against meningococcal disease. You may also need additional booster doses.  Varicella immunization.** / Consult your caregiver.  Hepatitis A immunization.** / Consult your caregiver. 2 doses, 6 to 18 months apart.  Hepatitis B immunization.** / Consult your caregiver. 3 doses usually over 6 months. Ages 40 to 64  Blood pressure check.** / Every 1 to 2 years.  Lipid and cholesterol check.** / Every 5 years beginning at age 20.  Clinical breast exam.** / Every year after age 40.  Mammogram.** / Every year beginning at age 40   and continuing for as long as you are in good health. Consult with your  caregiver.  Pap test.** / Every 3 years starting at age 30 through age 65 or 70 with a history of 3 consecutive normal Pap tests.  HPV screening.** / Every 3 years from ages 30 through ages 65 to 70 with a history of 3 consecutive normal Pap tests.  Fecal occult blood test (FOBT) of stool. / Every year beginning at age 50 and continuing until age 75. You may not need to do this test if you get a colonoscopy every 10 years.  Flexible sigmoidoscopy or colonoscopy.** / Every 5 years for a flexible sigmoidoscopy or every 10 years for a colonoscopy beginning at age 50 and continuing until age 75.  Hepatitis C blood test.** / For all people born from 1945 through 1965 and any individual with known risks for hepatitis C.  Skin self-exam. / Monthly.  Influenza immunization.** / Every year.  Pneumococcal polysaccharide immunization.** / 1 to 2 doses if you smoke cigarettes or if you have certain chronic medical conditions.  Tetanus, diphtheria, pertussis (Tdap, Td) immunization.** / A one-time dose of Tdap vaccine. After that, you need a Td booster dose every 10 years.  Measles, mumps, rubella (MMR) immunization. / You need at least 1 dose of MMR if you were born in 1957 or later. You may also need a second dose.  Varicella immunization.** / Consult your caregiver.  Meningococcal immunization.** / Consult your caregiver.  Hepatitis A immunization.** / Consult your caregiver. 2 doses, 6 to 18 months apart.  Hepatitis B immunization.** / Consult your caregiver. 3 doses, usually over 6 months. Ages 65 and over  Blood pressure check.** / Every 1 to 2 years.  Lipid and cholesterol check.** / Every 5 years beginning at age 20.  Clinical breast exam.** / Every year after age 40.  Mammogram.** / Every year beginning at age 40 and continuing for as long as you are in good health. Consult with your caregiver.  Pap test.** / Every 3 years starting at age 30 through age 65 or 70 with a 3  consecutive normal Pap tests. Testing can be stopped between 65 and 70 with 3 consecutive normal Pap tests and no abnormal Pap or HPV tests in the past 10 years.  HPV screening.** / Every 3 years from ages 30 through ages 65 or 70 with a history of 3 consecutive normal Pap tests. Testing can be stopped between 65 and 70 with 3 consecutive normal Pap tests and no abnormal Pap or HPV tests in the past 10 years.  Fecal occult blood test (FOBT) of stool. / Every year beginning at age 50 and continuing until age 75. You may not need to do this test if you get a colonoscopy every 10 years.  Flexible sigmoidoscopy or colonoscopy.** / Every 5 years for a flexible sigmoidoscopy or every 10 years for a colonoscopy beginning at age 50 and continuing until age 75.  Hepatitis C blood test.** / For all people born from 1945 through 1965 and any individual with known risks for hepatitis C.  Osteoporosis screening.** / A one-time screening for women ages 65 and over and women at risk for fractures or osteoporosis.  Skin self-exam. / Monthly.  Influenza immunization.** / Every year.  Pneumococcal polysaccharide immunization.** / 1 dose at age 65 (or older) if you have never been vaccinated.  Tetanus, diphtheria, pertussis (Tdap, Td) immunization. / A one-time dose of Tdap vaccine if you are over   65 and have contact with an infant, are a healthcare worker, or simply want to be protected from whooping cough. After that, you need a Td booster dose every 10 years.  Varicella immunization.** / Consult your caregiver.  Meningococcal immunization.** / Consult your caregiver.  Hepatitis A immunization.** / Consult your caregiver. 2 doses, 6 to 18 months apart.  Hepatitis B immunization.** / Check with your caregiver. 3 doses, usually over 6 months. ** Family history and personal history of risk and conditions may change your caregiver's recommendations. Document Released: 03/11/2001 Document Revised: 04/07/2011  Document Reviewed: 06/10/2010 [employer] Patient Information 2014 [employer]  

## 2012-10-21 NOTE — Progress Notes (Signed)
Subjective:     [name] [name] is a 47 y.o. female and is here for a comprehensive physical exam. The patient reports no problems.  History   Social History  . Marital Status: Married    Spouse Name: N/A    Number of Children: 2  . 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     Social History Main Topics  . Smoking status: Former Smoker -- 1.00 packs/day for 20 years    Types: Cigarettes    Quit date: 01/28/2003  . Smokeless tobacco: Never Used  . Alcohol Use: 2.4 oz/week    4 Glasses of wine per week  . Drug Use: No  . Sexual Activity: Yes    Partners: Male   Other Topics Concern  . Not on file   Social History Narrative   Exercise-- no   Health Maintenance  Topic Date Due  . Influenza Vaccine  11/20/2012  . Mammogram  04/27/2013  . Tetanus/tdap  10/07/2015  . Pap Smear  10/22/2015    The following portions of the patient's history were reviewed and updated as appropriate:  She  has a past medical history of Ulcerative colitis; Vitamin B12 deficiency; Osteoporosis; Cellulitis of right groin; Spontaneous pneumothorax (08/2002); Internal hemorrhoids; Hypokalemia; Allergic rhinitis; Sinusitis; Chronic anemia; and Arthritis. She  does not have any pertinent problems on file. She  has past surgical history that includes Wisdom tooth extraction; Lung surgery (08/2002); Sigmoidoscopy (03/29/2010); and Colonoscopy (08/30/2003). Her family history includes COPD in her father; Colon cancer in an other family member; Ulcerative colitis in her daughter. She was adopted. She  reports that she quit smoking about 9 years ago. Her smoking use included Cigarettes. She has a 20 pack-year smoking history. She has never used smokeless tobacco. She reports that she drinks about 2.4 ounces of alcohol per week. She reports that she does not use illicit drugs. She has a current medication list which includes the following prescription(s):  multivitamin. Current Outpatient Prescriptions on File Prior to Visit  Medication Sig Dispense Refill  . Multiple Vitamin (MULTIVITAMIN) tablet Take 1 tablet by mouth daily.         No current facility-administered medications on file prior to visit.   She is allergic to aspirin and penicillins..  Review of Systems Review of Systems  Constitutional: Negative for activity change, appetite change and fatigue.  HENT: Negative for hearing loss, congestion, tinnitus and ear discharge.  dentist q17m Eyes: Negative for visual disturbance (see optho q1y -- vision corrected to 20/20 with glasses).  Respiratory: Negative for cough, chest tightness and shortness of breath.   Cardiovascular: Negative for chest pain, palpitations and leg swelling.  Gastrointestinal: Negative for abdominal pain, diarrhea, constipation and abdominal distention.  Genitourinary: Negative for urgency, frequency, decreased urine volume and difficulty urinating.  Musculoskeletal: Negative for back pain, arthralgias and gait problem.  Skin: Negative for color change, pallor and rash.  Neurological: Negative for dizziness, light-headedness, numbness and headaches.  Hematological: Negative for adenopathy. Does not bruise/bleed easily.  Psychiatric/Behavioral: Negative for suicidal ideas, confusion, sleep disturbance, self-injury, dysphoric mood, decreased concentration and agitation.      Objective:    Temp(Src) 97.9 F (36.6 C) (Oral)  Ht 5' 5.5" (1.664 m)  Wt 156 lb 9.6 oz (71.033 kg)  BMI 25.65 kg/m2  LMP 10/07/2012 General appearance: alert, cooperative, appears stated age and no distress Head: Normocephalic, without obvious abnormality, atraumatic Eyes: conjunctivae/corneas  clear. PERRL, EOM's intact. Fundi benign. Ears: normal TM's and external ear canals both ears Nose: Nares normal. Septum midline. Mucosa normal. No drainage or sinus tenderness. Throat: lips, mucosa, and tongue normal; teeth and gums  normal Neck: no adenopathy, no carotid bruit, no JVD, supple, symmetrical, trachea midline and thyroid not enlarged, symmetric, no tenderness/mass/nodules Back: symmetric, no curvature. ROM normal. No CVA tenderness. Lungs: clear to auscultation bilaterally Breasts: normal appearance, no masses or tenderness Heart: regular rate and rhythm, S1, S2 normal, no murmur, click, rub or gallop Abdomen: soft, non-tender; bowel sounds normal; no masses,  no organomegaly Pelvic: cervix normal in appearance, external genitalia normal, no adnexal masses or tenderness, no cervical motion tenderness, rectovaginal septum normal, uterus normal size, shape, and consistency and vagina normal without discharge--pap done Extremities: extremities normal, atraumatic, no cyanosis or edema Pulses: 2+ and symmetric Skin: Skin color, texture, turgor normal. No rashes or lesions Lymph nodes: Cervical, supraclavicular, and axillary nodes normal. Neurologic: Alert and oriented X 3, normal strength and tone. Normal symmetric reflexes. Normal coordination and gait Psych-- no depression, no anxiety      Assessment:    Healthy female exam.       Plan:    ghm utd Check labs See After Visit Summary for Counseling Recommendations

## 2012-10-22 LAB — HEMOCCULT GUIAC POC 1CARD (OFFICE): Fecal Occult Blood, POC: POSITIVE

## 2012-10-22 NOTE — Telephone Encounter (Signed)
Unable to reach pre visit.  

## 2012-10-24 NOTE — Assessment & Plan Note (Signed)
Per GI No recent problems

## 2012-10-26 ENCOUNTER — Other Ambulatory Visit (INDEPENDENT_AMBULATORY_CARE_PROVIDER_SITE_OTHER): Payer: BC Managed Care – PPO

## 2012-10-26 DIAGNOSIS — Z0000 Encounter for general adult medical examination without abnormal findings: Secondary | ICD-10-CM

## 2012-10-26 LAB — CBC WITH DIFFERENTIAL/PLATELET
Basophils Relative: 0.5 % (ref 0.0–3.0)
Eosinophils Relative: 2.3 % (ref 0.0–5.0)
HCT: 38.7 % (ref 36.0–46.0)
Hemoglobin: 12.9 g/dL (ref 12.0–15.0)
Lymphocytes Relative: 27.6 % (ref 12.0–46.0)
Lymphs Abs: 1.2 10*3/uL (ref 0.7–4.0)
MCV: 89.9 fl (ref 78.0–100.0)
Monocytes Absolute: 0.2 10*3/uL (ref 0.1–1.0)
Monocytes Relative: 5.5 % (ref 3.0–12.0)
Neutrophils Relative %: 64.1 % (ref 43.0–77.0)
Platelets: 145 10*3/uL — ABNORMAL LOW (ref 150.0–400.0)
RBC: 4.3 Mil/uL (ref 3.87–5.11)
WBC: 4.2 10*3/uL — ABNORMAL LOW (ref 4.5–10.5)

## 2012-10-26 LAB — BASIC METABOLIC PANEL
Calcium: 8.7 mg/dL (ref 8.4–10.5)
Chloride: 106 mEq/L (ref 96–112)
Potassium: 4.3 mEq/L (ref 3.5–5.1)

## 2012-10-26 LAB — POCT URINALYSIS DIPSTICK
Bilirubin, UA: NEGATIVE
Glucose, UA: NEGATIVE
Ketones, UA: NEGATIVE
Leukocytes, UA: NEGATIVE

## 2012-10-26 LAB — HEPATIC FUNCTION PANEL
ALT: 22 U/L (ref 0–35)
Albumin: 3.8 g/dL (ref 3.5–5.2)
Alkaline Phosphatase: 51 U/L (ref 39–117)

## 2012-10-26 LAB — LIPID PANEL
Cholesterol: 243 mg/dL — ABNORMAL HIGH (ref 0–200)
HDL: 45.3 mg/dL (ref >39.00)
Total CHOL/HDL Ratio: 5
VLDL: 40 mg/dL (ref 0.0–40.0)

## 2012-11-01 ENCOUNTER — Other Ambulatory Visit: Payer: Self-pay

## 2012-11-01 ENCOUNTER — Other Ambulatory Visit: Payer: Self-pay | Admitting: Family Medicine

## 2012-11-01 ENCOUNTER — Telehealth: Payer: Self-pay | Admitting: Family Medicine

## 2012-11-01 DIAGNOSIS — IMO0002 Reserved for concepts with insufficient information to code with codable children: Secondary | ICD-10-CM

## 2012-11-01 DIAGNOSIS — N841 Polyp of cervix uteri: Secondary | ICD-10-CM

## 2012-11-01 NOTE — Telephone Encounter (Signed)
I spoke w/pt. She states she thought the referral was for a cervical polyp that was found during her exam. Please advise and if that is the case, I will proceed with referral.   Please make the patient aware.  ----- Message -----  From: Marshell Garfinkel  Sent: 11/01/2012 2:11 PM  To: Arnette Norris, CMA  Subject: referral to gyn  I spoke w/ [facility]. They looked at the patient's chart and stated that they would not do anything for the patient bc her pap was normal. She states the patient just needs to repeat her pap in 1 year. Please advise.

## 2012-11-01 NOTE — Progress Notes (Signed)
  Subjective:    Patient ID: [name] [name], female    DOB: 17-May-1965, 47 y.o.   MRN: [id number]  HPI    Review of Systems     Objective:   Physical Exam   Pelvic-- cervix-+ polyp     Assessment & Plan:

## 2012-11-01 NOTE — Telephone Encounter (Signed)
Patient had a neg pap but + HPV, this is what I was doing the referral for. Dr.Lowne did she has a polyp that needed to be addressed because this is not what the patient and I discussed? Please advise     KP

## 2012-11-01 NOTE — Telephone Encounter (Signed)
Referral done

## 2012-11-08 ENCOUNTER — Telehealth: Payer: Self-pay

## 2012-11-08 MED ORDER — AZATHIOPRINE 50 MG PO TABS: ORAL_TABLET | ORAL | Status: DC

## 2012-11-08 NOTE — Telephone Encounter (Signed)
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

## 2012-11-08 NOTE — Telephone Encounter (Signed)
[name] [name] is doing well , no current U.C. Sxs.   She would like to restart her azathioprine 50mg , take 1.5 tablets daily.  This was sent in to the [facility] in [place] and appointment made to see Dr. Stan Head Novermber 18th.

## 2012-11-16 ENCOUNTER — Encounter: Payer: BC Managed Care – PPO | Admitting: Obstetrics & Gynecology

## 2012-12-02 ENCOUNTER — Encounter: Payer: BC Managed Care – PPO | Admitting: Obstetrics & Gynecology

## 2012-12-08 ENCOUNTER — Ambulatory Visit (INDEPENDENT_AMBULATORY_CARE_PROVIDER_SITE_OTHER): Payer: BC Managed Care – PPO | Admitting: Obstetrics & Gynecology

## 2012-12-08 ENCOUNTER — Encounter: Payer: Self-pay | Admitting: Obstetrics & Gynecology

## 2012-12-08 VITALS — BP 125/88 | HR 74 | Resp 16 | Ht 66.0 in | Wt 151.0 lb

## 2012-12-08 DIAGNOSIS — N841 Polyp of cervix uteri: Secondary | ICD-10-CM

## 2012-12-08 DIAGNOSIS — N946 Dysmenorrhea, unspecified: Secondary | ICD-10-CM

## 2012-12-08 NOTE — Progress Notes (Signed)
  Subjective:    Patient ID: [name] [name], female    DOB: 12/10/1965, 47 y.o.   MRN: [id number]  HPI  [name] [name] is a 62 MW P2 who was referred here for evaluation of a cervical polyp. She tells me that she has a cryo in the past. Her pap smear 9/14 was normal with +HPV.  Review of Systems She does have dysmenorrhea    Objective:   Physical Exam  On exam there was a large amount of clear thick mucous noted but after wiping it away I was able to see an area of the endocervix that may be a polyp versus repairtive changes after a cryo. After she agreed, I used uterine dressing forceps to grab this area. It did not seem to be separate from the cervix (flush with the endocervix). I made several grabs at it and sent to pathology the bloody mucous that I obtained.      Assessment & Plan:  Polyp versus normal cervix- I will await the pathol and see her back in 2 weeks to discuss the results as well as her dysmenorrhea.

## 2012-12-14 ENCOUNTER — Encounter: Payer: Self-pay | Admitting: Internal Medicine

## 2012-12-14 ENCOUNTER — Ambulatory Visit (INDEPENDENT_AMBULATORY_CARE_PROVIDER_SITE_OTHER): Payer: BC Managed Care – PPO | Admitting: Internal Medicine

## 2012-12-14 VITALS — BP 114/62 | HR 84 | Ht 66.0 in | Wt 154.2 lb

## 2012-12-14 DIAGNOSIS — K5100 Ulcerative (chronic) pancolitis without complications: Secondary | ICD-10-CM

## 2012-12-14 DIAGNOSIS — M810 Age-related osteoporosis without current pathological fracture: Secondary | ICD-10-CM

## 2012-12-14 DIAGNOSIS — Z79899 Other long term (current) drug therapy: Secondary | ICD-10-CM

## 2012-12-14 DIAGNOSIS — Z23 Encounter for immunization: Secondary | ICD-10-CM

## 2012-12-14 DIAGNOSIS — E538 Deficiency of other specified B group vitamins: Secondary | ICD-10-CM

## 2012-12-14 DIAGNOSIS — Z9119 Patient's noncompliance with other medical treatment and regimen: Secondary | ICD-10-CM

## 2012-12-14 DIAGNOSIS — Z91199 Patient's noncompliance with other medical treatment and regimen due to unspecified reason: Secondary | ICD-10-CM

## 2012-12-14 NOTE — Assessment & Plan Note (Signed)
B12 level in Jan

## 2012-12-14 NOTE — Progress Notes (Signed)
  Subjective:    Patient ID: [name] [name], female    DOB: Mar 08, 1965, 47 y.o.   MRN: [id number]  HPI Has been doing well overall but cannot remember to take AZA daily. She's been under quite a bit of stress with her daughter who has ulcerative colitis though now it think she has Crohn's disease. She has had a colectomy and ileoanal pouch. She is to see a gastroenterologist about trying biologic therapy again, which did not work in the past. Hospital doctor becomes a little tearful talking about this. She thinks in general she is doing okay though when she saw Dr. Laury Axon the last couple months, she had a little bit of bleeding going on from the rectum. She has not seen me since June of last year. She has intermittently taken her azathioprine. Medications, allergies, past medical history, past surgical history, family history and social history are reviewed and updated in the EMR.  Review of Systems Daughter has had a colectomy - dx change from UC to Crohn's + fatigue    Objective:   Physical Exam General:  NAD Eyes:   anicteric Lungs:  clear Heart:  S1S2 no rubs, murmurs or gallops Abdomen:  soft and nontender, BS+ Ext:   no edema     Assessment & Plan:  Universal ulcerative (chronic) colitis - Plan: CBC with Differential, Hepatic function panel, Thiopurine Metabolites  Encounter for long-term (current) use of other high-risk medications - Plan: Thiopurine Metabolites  VITAMIN B12 DEFICIENCY - Plan: Vitamin B12  OSTEOPOROSIS - Plan: DG Bone Density, Vit D  25 hydroxy (rtn osteoporosis monitoring)  Need for prophylactic vaccination and inoculation against influenza - Plan: Flu Vaccine QUAD 36+ mos IM

## 2012-12-14 NOTE — Patient Instructions (Addendum)
You have been scheduled for a bone density test on 12/28/12 at  11:30am Please go to radiology on the basement floor of [facility] for this test. No preparation is necessary.   Please come and get labs January 2015, no appointment needed, the lab is open 7:30-5:30pm.  Please have these labs done prior to a follow up appointment in Feb. 2015 with [place].    Today you have been given a flu shot along with information on this vaccine.    I appreciate the opportunity to care for you.

## 2012-12-14 NOTE — Assessment & Plan Note (Signed)
Must take meds qd Refill AZA See me February CBC and LFT's before that and thiopurine metabolites

## 2012-12-14 NOTE — Assessment & Plan Note (Addendum)
Flu vaccine today Eventual derm

## 2012-12-14 NOTE — Assessment & Plan Note (Addendum)
DEXA recheck Vit D Jan

## 2012-12-15 NOTE — Assessment & Plan Note (Signed)
Recurrent issue in face of serious illness of daugfhter

## 2012-12-21 ENCOUNTER — Encounter: Payer: Self-pay | Admitting: Internal Medicine

## 2012-12-22 MED ORDER — AZATHIOPRINE 50 MG PO TABS: ORAL_TABLET | ORAL | Status: DC

## 2012-12-28 ENCOUNTER — Ambulatory Visit (INDEPENDENT_AMBULATORY_CARE_PROVIDER_SITE_OTHER)
Admission: RE | Admit: 2012-12-28 | Discharge: 2012-12-28 | Disposition: A | Discharge status: Home / Self Care | Payer: BC Managed Care – PPO | Source: Ambulatory Visit | Attending: Internal Medicine | Admitting: Internal Medicine

## 2012-12-28 ENCOUNTER — Telehealth: Payer: Self-pay

## 2012-12-28 DIAGNOSIS — M810 Age-related osteoporosis without current pathological fracture: Secondary | ICD-10-CM

## 2012-12-28 MED ORDER — MERCAPTOPURINE 50 MG PO TABS: 50.0000 mg | ORAL_TABLET | Freq: Every day | ORAL | Status: AC

## 2012-12-28 NOTE — Telephone Encounter (Signed)
Message copied by Annett Fabian on Tue Dec 28, 2012  9:36 AM ------      Message from: Stan Head E      Created: Mon Dec 27, 2012  5:04 PM      Regarding:       Have her take 50 mg daily            # 30 and 2 refills ------

## 2012-12-28 NOTE — Telephone Encounter (Signed)
Patient aware She will call for any additional questions or concerns

## 2012-12-29 ENCOUNTER — Encounter: Payer: Self-pay | Admitting: Obstetrics & Gynecology

## 2012-12-29 ENCOUNTER — Ambulatory Visit (INDEPENDENT_AMBULATORY_CARE_PROVIDER_SITE_OTHER): Payer: BC Managed Care – PPO | Admitting: Obstetrics & Gynecology

## 2012-12-29 DIAGNOSIS — N946 Dysmenorrhea, unspecified: Secondary | ICD-10-CM

## 2012-12-29 NOTE — Progress Notes (Addendum)
   Subjective:    Patient ID: [name] [name], female    DOB: 1965-09-24, 47 y.o.   MRN: [id number]  HPI  47 yo MW ( 2 teenage daughters.[facility]) here for f/u of endocervical biopsy (for a ? Polyp). Biopsy was normal. Pap was normal.  She also is here to discuss her painful periods. Menarche at 47 yo. Periods used to be monthly for 5 days but over the last 6 months they have become sporadic, lasting now anywhere from 3-6 days and coming every 4-8 weeks. She had a normal TSH recently and is aware that this problem is most likely related to perimenopause and is normal.  Her other problem is worsening dysmenorrhea. She has tried Tylenol and IBU with no relief.   Review of Systems Her husband has had a vasectomy. She has already a flu vaccine.    Objective:   Physical Exam  Normal breast exam      Assessment & Plan:  Dysmenorrhea- gyn u/s ordered I have given her option of Mirena and OCPs.

## 2012-12-30 ENCOUNTER — Ambulatory Visit (INDEPENDENT_AMBULATORY_CARE_PROVIDER_SITE_OTHER): Payer: BC Managed Care – PPO

## 2012-12-30 DIAGNOSIS — D259 Leiomyoma of uterus, unspecified: Secondary | ICD-10-CM

## 2012-12-30 DIAGNOSIS — N946 Dysmenorrhea, unspecified: Secondary | ICD-10-CM

## 2012-12-30 DIAGNOSIS — D261 Other benign neoplasm of corpus uteri: Secondary | ICD-10-CM

## 2013-01-05 ENCOUNTER — Encounter: Payer: Self-pay | Admitting: Internal Medicine

## 2013-01-05 NOTE — Progress Notes (Signed)
Quick Note:  Please let her know the DEXA shows osteoporosis  Needs appt Dr. Laury Axon to discuss Tx - note vit D level and other labs is to be done in Jan - she should at least be taking 1200 mg elemental calcium and 1000 IU vit D daily for now if not   Needs to schedule feb appt w/ me now please  Am ccing Dr. Laury Axon ______

## 2013-01-06 ENCOUNTER — Telehealth: Payer: Self-pay

## 2013-01-06 NOTE — Telephone Encounter (Signed)
Patient informed of her DEXA results , supplements for Calcium and Vitamin D to take, that labs due in January and to come see [place] in February , she will call back to make the appointment.

## 2013-01-19 ENCOUNTER — Ambulatory Visit: Payer: BC Managed Care – PPO | Admitting: Obstetrics & Gynecology

## 2013-07-15 ENCOUNTER — Ambulatory Visit (INDEPENDENT_AMBULATORY_CARE_PROVIDER_SITE_OTHER): Payer: BC Managed Care – PPO | Admitting: Family Medicine

## 2013-07-15 ENCOUNTER — Encounter: Payer: Self-pay | Admitting: Family Medicine

## 2013-07-15 VITALS — BP 114/68 | HR 74 | Temp 98.1°F | Wt 156.6 lb

## 2013-07-15 DIAGNOSIS — M25532 Pain in left wrist: Secondary | ICD-10-CM

## 2013-07-15 DIAGNOSIS — M25539 Pain in unspecified wrist: Secondary | ICD-10-CM

## 2013-07-15 NOTE — Progress Notes (Signed)
Pre visit review using our clinic review tool, if applicable. No additional management support is needed unless otherwise documented below in the visit note. 

## 2013-07-15 NOTE — Progress Notes (Signed)
   Subjective:    Patient ID: [name] [name], female    DOB: 03-Jun-1965, 48 y.o.   MRN: [id number]  HPI Pt here c/o L wrist pain after fall about 6 weeks ago.  She went to urgent care and xrays were done-- she was told it was not broken and it would take 6-8 weeks to heal.  Pt states it is no better.  Pt fell down 4-5 brick steps when her dog pulled her down them.     Review of Systems As above    Objective:   Physical Exam BP 114/68  Pulse 74  Temp(Src) 98.1 F (36.7 C) (Oral)  Wt 156 lb 9.6 oz (71.033 kg)  SpO2 99%  LMP 07/05/2013 General appearance: alert, cooperative, appears stated age and no distress Extremities: L wrist -- swollen and tender to flex        Assessment & Plan:  1. Left wrist pain Refer to sport med, con't with splint  Call or rto if symptoms worsen - Ambulatory referral to Sports Medicine

## 2013-07-15 NOTE — Patient Instructions (Signed)

## 2013-07-25 ENCOUNTER — Ambulatory Visit (INDEPENDENT_AMBULATORY_CARE_PROVIDER_SITE_OTHER): Payer: BC Managed Care – PPO | Admitting: Family Medicine

## 2013-07-25 ENCOUNTER — Encounter: Payer: Self-pay | Admitting: Family Medicine

## 2013-07-25 ENCOUNTER — Ambulatory Visit (HOSPITAL_BASED_OUTPATIENT_CLINIC_OR_DEPARTMENT_OTHER)
Admission: RE | Admit: 2013-07-25 | Discharge: 2013-07-25 | Disposition: A | Discharge status: Home / Self Care | Payer: BC Managed Care – PPO | Source: Ambulatory Visit | Attending: Family Medicine | Admitting: Family Medicine

## 2013-07-25 VITALS — BP 129/85 | HR 79 | Ht 66.0 in | Wt 160.0 lb

## 2013-07-25 DIAGNOSIS — S6992XA Unspecified injury of left wrist, hand and finger(s), initial encounter: Secondary | ICD-10-CM

## 2013-07-25 DIAGNOSIS — S59909A Unspecified injury of unspecified elbow, initial encounter: Secondary | ICD-10-CM

## 2013-07-25 DIAGNOSIS — S62113A Displaced fracture of triquetrum [cuneiform] bone, unspecified wrist, initial encounter for closed fracture: Secondary | ICD-10-CM | POA: Insufficient documentation

## 2013-07-25 DIAGNOSIS — Y929 Unspecified place or not applicable: Secondary | ICD-10-CM | POA: Insufficient documentation

## 2013-07-25 DIAGNOSIS — W19XXXA Unspecified fall, initial encounter: Secondary | ICD-10-CM | POA: Insufficient documentation

## 2013-07-25 DIAGNOSIS — S6990XA Unspecified injury of unspecified wrist, hand and finger(s), initial encounter: Secondary | ICD-10-CM

## 2013-07-25 DIAGNOSIS — S59919A Unspecified injury of unspecified forearm, initial encounter: Secondary | ICD-10-CM

## 2013-07-25 MED ORDER — DIAZEPAM 5 MG PO TABS: ORAL_TABLET | ORAL | Status: AC

## 2013-07-25 NOTE — Patient Instructions (Signed)
I'm concerned you have a fracture to your scaphoid or the ligament between the scaphoid and lunate (bones of your wrist). These commonly won't be seen on x-rays. We will go ahead with an MRI; I'll call you the business day following this to go over results. In meantime wear brace as often as possible. Ok to take off to ice and shower. Take valium as directed if needed for the MRI.

## 2013-07-26 ENCOUNTER — Ambulatory Visit (HOSPITAL_BASED_OUTPATIENT_CLINIC_OR_DEPARTMENT_OTHER)
Admission: RE | Admit: 2013-07-26 | Discharge: 2013-07-26 | Disposition: A | Discharge status: Home / Self Care | Payer: BC Managed Care – PPO | Source: Ambulatory Visit | Attending: Family Medicine | Admitting: Family Medicine

## 2013-07-26 DIAGNOSIS — M65839 Other synovitis and tenosynovitis, unspecified forearm: Secondary | ICD-10-CM | POA: Insufficient documentation

## 2013-07-26 DIAGNOSIS — S59919A Unspecified injury of unspecified forearm, initial encounter: Principal | ICD-10-CM

## 2013-07-26 DIAGNOSIS — S59909A Unspecified injury of unspecified elbow, initial encounter: Secondary | ICD-10-CM | POA: Insufficient documentation

## 2013-07-26 DIAGNOSIS — S62143A Displaced fracture of body of hamate [unciform] bone, unspecified wrist, initial encounter for closed fracture: Secondary | ICD-10-CM | POA: Insufficient documentation

## 2013-07-26 DIAGNOSIS — X58XXXA Exposure to other specified factors, initial encounter: Secondary | ICD-10-CM | POA: Insufficient documentation

## 2013-07-26 DIAGNOSIS — M65849 Other synovitis and tenosynovitis, unspecified hand: Secondary | ICD-10-CM

## 2013-07-26 DIAGNOSIS — S6992XA Unspecified injury of left wrist, hand and finger(s), initial encounter: Secondary | ICD-10-CM

## 2013-07-26 DIAGNOSIS — S6990XA Unspecified injury of unspecified wrist, hand and finger(s), initial encounter: Principal | ICD-10-CM

## 2013-07-27 ENCOUNTER — Encounter: Payer: Self-pay | Admitting: Family Medicine

## 2013-07-27 DIAGNOSIS — S6992XA Unspecified injury of left wrist, hand and finger(s), initial encounter: Secondary | ICD-10-CM | POA: Insufficient documentation

## 2013-07-27 NOTE — Progress Notes (Signed)
Patient ID: [name] [name], female   DOB: [birth year], 48 y.o.   MRN: [id number]  PCP: Garnet Koyanagi, DO  Subjective:   HPI: Patient is a 48 y.o. female here for left wrist injury.  Pateint reports on 5/1 she was pulled down 4-5 brick steps by a dog. Leash was wrapped around her hand at the time. Immediate pain, swelling. Went to [facility] - radiographs read as negative. Given brace which she has been wearing as needed.  Past Medical History  Diagnosis Date  . Ulcerative colitis     Anemia  . Vitamin B12 deficiency   . Osteoporosis   . Cellulitis of right groin   . Spontaneous pneumothorax 08/2002  . Internal hemorrhoids   . Hypokalemia   . Allergic rhinitis   . Sinusitis   . Chronic anemia   . Arthritis     Current Outpatient Prescriptions on File Prior to Visit  Medication Sig Dispense Refill  . Ascorbic Acid (VITAMIN C) 100 MG tablet Take 100 mg by mouth daily.      Marland Kitchen azaTHIOprine (IMURAN) 50 MG tablet Take 50 mg by mouth daily.      . mercaptopurine (PURINETHOL) 50 MG tablet Take 1 tablet (50 mg total) by mouth daily. Give on an empty stomach 1 hour before or 2 hours after meals. Caution: Chemotherapy.  30 tablet  2  . Methylcobalamin (B12-ACTIVE) 1 MG CHEW Chew 1 tablet by mouth daily.      . Multiple Vitamin (MULTIVITAMIN) tablet Take 1 tablet by mouth daily.         No current facility-administered medications on file prior to visit.    Past Surgical History  Procedure Laterality Date  . Wisdom tooth extraction    . Lung surgery  08/2002    for spontaneous pneumothrorax  . Sigmoidoscopy  03/29/2010    mod-severe UC, also 2008 - severe UC, 2007 - UC  . Colonoscopy  08/30/2003    microscopic colitis, normal terminal ileum    Allergies  Allergen Reactions  . Aspirin   . Penicillins     History   Social History  . Marital Status: Married    Spouse Name: N/A    Number of Children: 2  . 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     Social History Main Topics  . Smoking status: Former Smoker -- 1.00 packs/day for 20 years    Types: Cigarettes    Quit date: 01/28/2003  . Smokeless tobacco: Never Used  . Alcohol Use: 2.4 oz/week    4 Glasses of wine per week  . Drug Use: No  . Sexual Activity: Yes    Partners: Male   Other Topics Concern  . Not on file   Social History Narrative   Exercise-- no    Family History  Problem Relation Age of Onset  . Adopted: Yes  . Ulcerative colitis Daughter   . COPD Father   . Colon cancer Neg Hx   . Diabetes Neg Hx   . Heart disease Neg Hx     BP 129/85  Pulse 79  Ht 5\' 6"  (1.676 m)  Wt 160 lb (72.576 kg)  BMI 25.84 kg/m2  LMP 07/05/2013  Review of Systems: See HPI above.    Objective:  Physical Exam:  Gen: NAD  Left wrist: Mild swelling, no bruising or other deformity. TTP dorsally over scaphoid, scapholunate ligament, triquetrum.  No distal radius, other  wrist/hand tenderness. Very limited flexion and extension of wrist. Strength 5/5 with finger abduction, extension, flexion. NVI distally.    Assessment & Plan:  1. Left wrist injury - I reviewed outside radiographs and they appear to have a triquetrum avulsion fracture, not normal as she was informed.  Repeat radiographs here confirm this though fracture line more obscured.  Typically these fractures heal well over 4-6 weeks and she is still struggling with pain, swelling, decreased motion.  Additionally she has tenderness at snuffbox and self-reports pain worse when making a fist.  Concern for second fracture - one of scaphoid or scapholunate dissociation.  Will move forward with MRI to assess.    Addendum:  MRI reviewed and discussed with patient.  She does not have any additional fractures outside triquetrum and no evidence ligament tear (does have tendinopathy of 1st dorsal compartment tendons though).  Recommended we try immobilization with short arm cast for 4-6 weeks as she  has only been using wrist brace as needed.  She reports she wants to try wrist brace at all times for 2 weeks first - she is going out of town and does not want to wear a cast yet.  She will call us when she returns for a follow-up and cast placement.  Concerned with her being 2 months out she may need to see hand surgery for operative intervention.  Discussed risks of not casting at this point.

## 2013-07-27 NOTE — Assessment & Plan Note (Signed)
I reviewed outside radiographs and they appear to have a triquetrum avulsion fracture, not normal as she was informed.  Repeat radiographs here confirm this though fracture line more obscured.  Typically these fractures heal well over 4-6 weeks and she is still struggling with pain, swelling, decreased motion.  Additionally she has tenderness at snuffbox and self-reports pain worse when making a fist.  Concern for second fracture - one of scaphoid or scapholunate dissociation.  Will move forward with MRI to assess.

## 2013-09-19 ENCOUNTER — Ambulatory Visit (INDEPENDENT_AMBULATORY_CARE_PROVIDER_SITE_OTHER): Payer: BC Managed Care – PPO | Admitting: Family Medicine

## 2013-09-19 ENCOUNTER — Encounter: Payer: Self-pay | Admitting: Family Medicine

## 2013-09-19 VITALS — BP 126/85 | HR 69 | Ht 67.0 in | Wt 155.0 lb

## 2013-09-19 DIAGNOSIS — S6992XD Unspecified injury of left wrist, hand and finger(s), subsequent encounter: Secondary | ICD-10-CM

## 2013-09-19 DIAGNOSIS — S59909A Unspecified injury of unspecified elbow, initial encounter: Secondary | ICD-10-CM

## 2013-09-19 DIAGNOSIS — S6990XA Unspecified injury of unspecified wrist, hand and finger(s), initial encounter: Secondary | ICD-10-CM

## 2013-09-19 DIAGNOSIS — S59919A Unspecified injury of unspecified forearm, initial encounter: Secondary | ICD-10-CM

## 2013-09-19 DIAGNOSIS — Z5189 Encounter for other specified aftercare: Secondary | ICD-10-CM

## 2013-09-20 ENCOUNTER — Encounter: Payer: Self-pay | Admitting: Family Medicine

## 2013-09-21 ENCOUNTER — Encounter: Payer: Self-pay | Admitting: Family Medicine

## 2013-09-21 NOTE — Assessment & Plan Note (Signed)
Triquetrum avulsion fracture confirmed by MRI without other associated injuries.  No evidence CRPS.  Very limited motion and a lot of stiffness.  Discussed her options - she wants to try home ROM exercises for a couple weeks before considering physical/occupational therapy.  I do not think at this point further immobilization would be helpful - believe this would be more detrimental to her recovering full pain-free motion.  Hand surgery referral also an option but surgical intervention rarely warranted for this type of fracture.  She will call us in 2 weeks if she wants to go ahead with occupational therapy as next step.

## 2013-09-21 NOTE — Progress Notes (Signed)
Patient ID: [name] [name], female   DOB: 1965-09-04, 48 y.o.   MRN: [id number]  PCP: Garnet Koyanagi, DO  Subjective:   HPI: Patient is a 48 y.o. female here for left wrist injury.  6/29: Pateint reports on 5/1 she was pulled down 4-5 brick steps by a dog. Leash was wrapped around her hand at the time. Immediate pain, swelling. Went to [facility] - radiographs read as negative. Given brace which she has been wearing as needed.  8/24: Patient reports she has improved - doesn't have pain at rest now. However has trouble picking up anything with weight because wrist and hand feel weak. Wearing brace majority of the time. Also has a knot around A1 pulley of 2nd digit but no locking, no pain.  Past Medical History  Diagnosis Date  . Ulcerative colitis     Anemia  . Vitamin B12 deficiency   . Osteoporosis   . Cellulitis of right groin   . Spontaneous pneumothorax 08/2002  . Internal hemorrhoids   . Hypokalemia   . Allergic rhinitis   . Sinusitis   . Chronic anemia   . Arthritis     Current Outpatient Prescriptions on File Prior to Visit  Medication Sig Dispense Refill  . Ascorbic Acid (VITAMIN C) 100 MG tablet Take 100 mg by mouth daily.      Marland Kitchen azaTHIOprine (IMURAN) 50 MG tablet Take 50 mg by mouth daily.      . diazepam (VALIUM) 5 MG tablet Take 1 tablet 30 minutes prior to procedure.  May repeat x 1  2 tablet  0  . mercaptopurine (PURINETHOL) 50 MG tablet Take 1 tablet (50 mg total) by mouth daily. Give on an empty stomach 1 hour before or 2 hours after meals. Caution: Chemotherapy.  30 tablet  2  . Methylcobalamin (B12-ACTIVE) 1 MG CHEW Chew 1 tablet by mouth daily.      . Multiple Vitamin (MULTIVITAMIN) tablet Take 1 tablet by mouth daily.         No current facility-administered medications on file prior to visit.    Past Surgical History  Procedure Laterality Date  . Wisdom tooth extraction    . Lung surgery  08/2002    for spontaneous pneumothrorax  . Sigmoidoscopy   03/29/2010    mod-severe UC, also 2008 - severe UC, 2007 - UC  . Colonoscopy  08/30/2003    microscopic colitis, normal terminal ileum    Allergies  Allergen Reactions  . Aspirin   . Penicillins     History   Social History  . Marital Status: Married    Spouse Name: N/A    Number of Children: 2  . 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     Social History Main Topics  . Smoking status: Former Smoker -- 1.00 packs/day for 20 years    Types: Cigarettes    Quit date: 01/28/2003  . Smokeless tobacco: Never Used  . Alcohol Use: 2.4 oz/week    4 Glasses of wine per week  . Drug Use: No  . Sexual Activity: Yes    Partners: Male   Other Topics Concern  . Not on file   Social History Narrative   Exercise-- no    Family History  Problem Relation Age of Onset  . Adopted: Yes  . Ulcerative colitis Daughter   . COPD Father   . Colon cancer Neg Hx   .  Diabetes Neg Hx   . Heart disease Neg Hx     BP 126/85  Pulse 69  Ht 5\' 7"  (1.702 m)  Wt 155 lb (70.308 kg)  BMI 24.27 kg/m2  Review of Systems: See HPI above.    Objective:  Physical Exam:  Gen: NAD  Left wrist: Mild swelling, no bruising or other deformity.  No warmth or erythema, skin color change. Minimal tenderness over triquetrum.  No snuffbox, other wrist/hand tenderness.   Very limited flexion and extension of wrist. Strength 5/5 with finger abduction, extension, flexion. NVI distally.    Assessment & Plan:  1. Left wrist injury - Triquetrum avulsion fracture confirmed by MRI without other associated injuries.  No evidence CRPS.  Very limited motion and a lot of stiffness.  Discussed her options - she wants to try home ROM exercises for a couple weeks before considering physical/occupational therapy.  I do not think at this point further immobilization would be helpful - believe this would be more detrimental to her recovering full pain-free motion.  Hand  surgery referral also an option but surgical intervention rarely warranted for this type of fracture.  She will call us in 2 weeks if she wants to go ahead with occupational therapy as next step.

## 2013-09-27 ENCOUNTER — Telehealth: Payer: Self-pay

## 2013-09-27 NOTE — Telephone Encounter (Signed)
Spoke with patient and informed her that she is overdue for lab work (orders in epic, expire in Nov.) and that she also needs an office visit with Dr. Carlean Purl and we need to set up her colonoscopy per Dr. Carlean Purl.  She was out of town when I reached her and hopefully she will come for labs and call back for appointments to be set up.  She reports she has been busy with her daughters being seen at [facility] with GI issues.  I told her she needs to take care of herself so she can take care of them.

## 2013-10-04 ENCOUNTER — Ambulatory Visit: Payer: BC Managed Care – PPO | Admitting: Family Medicine

## 2013-10-10 ENCOUNTER — Encounter: Payer: Self-pay | Admitting: Internal Medicine

## 2013-10-10 ENCOUNTER — Encounter: Payer: Self-pay | Admitting: *Deleted

## 2013-11-11 ENCOUNTER — Other Ambulatory Visit: Payer: Self-pay

## 2013-11-28 ENCOUNTER — Encounter: Payer: Self-pay | Admitting: Family Medicine

## 2013-12-20 ENCOUNTER — Telehealth: Payer: Self-pay | Admitting: Internal Medicine

## 2013-12-20 ENCOUNTER — Encounter: Payer: Self-pay | Admitting: Internal Medicine

## 2013-12-20 NOTE — Telephone Encounter (Signed)
Patient advised orders are in and to come at her convenience

## 2014-01-24 ENCOUNTER — Encounter: Payer: Self-pay | Admitting: Family Medicine

## 2014-03-06 ENCOUNTER — Encounter: Payer: BC Managed Care – PPO | Admitting: Internal Medicine
